# Patient Record
Sex: Female | Born: 1995 | Race: White | Hispanic: No | Marital: Single | Smoking: Former smoker
Health system: Southern US, Community
[De-identification: ages and names within clinical notes are randomized; demographics above are authoritative.]

## PROBLEM LIST (undated history)

## (undated) DIAGNOSIS — L0591 Pilonidal cyst without abscess: Secondary | ICD-10-CM

## (undated) DIAGNOSIS — F99 Mental disorder, not otherwise specified: Secondary | ICD-10-CM

## (undated) HISTORY — DX: Mental disorder, not otherwise specified: F99

## (undated) HISTORY — PX: TONSILLECTOMY: SUR1361

---

## 2016-06-06 ENCOUNTER — Emergency Department
Admission: EM | Admit: 2016-06-06 | Discharge: 2016-06-06 | Disposition: A | Discharge status: Home / Self Care | Payer: Self-pay | Attending: Emergency Medicine | Admitting: Emergency Medicine

## 2016-06-06 DIAGNOSIS — L03317 Cellulitis of buttock: Secondary | ICD-10-CM | POA: Insufficient documentation

## 2016-06-06 DIAGNOSIS — L0501 Pilonidal cyst with abscess: Secondary | ICD-10-CM | POA: Insufficient documentation

## 2016-06-06 DIAGNOSIS — Z87891 Personal history of nicotine dependence: Secondary | ICD-10-CM | POA: Insufficient documentation

## 2016-06-06 MED ORDER — TRAMADOL HCL 50 MG PO TABS
50.0000 mg | ORAL_TABLET | Freq: Once | ORAL | Status: AC
  Administered 2016-06-06: 50 mg via ORAL
  Filled 2016-06-06: qty 1

## 2016-06-06 MED ORDER — CLINDAMYCIN HCL 150 MG PO CAPS
300.0000 mg | ORAL_CAPSULE | Freq: Once | ORAL | Status: AC
Start: 2016-06-06 — End: 2016-06-06
  Administered 2016-06-06: 300 mg via ORAL
  Filled 2016-06-06: qty 2

## 2016-06-06 MED ORDER — LIDOCAINE HCL (PF) 1 % IJ SOLN
5.0000 mL | Freq: Once | INTRAMUSCULAR | Status: AC
  Administered 2016-06-06: 5 mL via INTRADERMAL
  Filled 2016-06-06: qty 5

## 2016-06-06 MED ORDER — LIDOCAINE-PRILOCAINE 2.5-2.5 % EX CREA: TOPICAL_CREAM | Freq: Once | CUTANEOUS | Status: DC

## 2016-06-06 MED ORDER — TRAMADOL HCL 50 MG PO TABS: 50.0000 mg | ORAL_TABLET | Freq: Four times a day (QID) | ORAL | Status: DC | PRN

## 2016-06-06 MED ORDER — CLINDAMYCIN HCL 300 MG PO CAPS: 300.0000 mg | ORAL_CAPSULE | Freq: Three times a day (TID) | ORAL | Status: DC

## 2016-06-06 NOTE — ED Notes (Signed)
4x4 gauze dressing applied

## 2016-06-06 NOTE — ED Notes (Signed)
Pt uprite on stretcher with no distress noted; reports abscess to coccyx with hx of same

## 2016-06-06 NOTE — ED Notes (Signed)
Pt in with co abscess to sacral area hx of the same 2  Years ago.

## 2016-06-06 NOTE — Discharge Instructions (Signed)
Cellulitis Cellulitis is an infection of the skin and the tissue beneath it. The infected area is usually red and tender. Cellulitis occurs most often in the arms and lower legs.  CAUSES  Cellulitis is caused by bacteria that enter the skin through cracks or cuts in the skin. The most common types of bacteria that cause cellulitis are staphylococci and streptococci. SIGNS AND SYMPTOMS   Redness and warmth.  Swelling.  Tenderness or pain.  Fever. DIAGNOSIS  Your health care provider can usually determine what is wrong based on a physical exam. Blood tests may also be done. TREATMENT  Treatment usually involves taking an antibiotic medicine. HOME CARE INSTRUCTIONS   Take your antibiotic medicine as directed by your health care provider. Finish the antibiotic even if you start to feel better.  Keep the infected arm or leg elevated to reduce swelling.  Apply a warm cloth to the affected area up to 4 times per day to relieve pain.  Take medicines only as directed by your health care provider.  Keep all follow-up visits as directed by your health care provider. SEEK MEDICAL CARE IF:   You notice red streaks coming from the infected area.  Your red area gets larger or turns dark in color.  Your bone or joint underneath the infected area becomes painful after the skin has healed.  Your infection returns in the same area or another area.  You notice a swollen bump in the infected area.  You develop new symptoms.  You have a fever. SEEK IMMEDIATE MEDICAL CARE IF:   You feel very sleepy.  You develop vomiting or diarrhea.  You have a general ill feeling (malaise) with muscle aches and pains.   This information is not intended to replace advice given to you by your health care provider. Make sure you discuss any questions you have with your health care provider.   Document Released: 09/24/2005 Document Revised: 09/05/2015 Document Reviewed: 03/01/2012 Elsevier Interactive  Patient Education 2016 Elsevier Inc.  Pilonidal Cyst A pilonidal cyst is a fluid-filled sac. It forms beneath the skin near your tailbone, at the top of the crease of your buttocks. A pilonidal cyst that is not large or infected may not cause symptoms or problems. If the cyst becomes irritated or infected, it may fill with pus. This causes pain and swelling (pilonidal abscess). An infected cyst may need to be treated with medicine, drained, or removed. CAUSES The cause of a pilonidal cyst is not known. One cause may be a hair that grows into your skin (ingrown hair). RISK FACTORS Pilonidal cysts are more common in boys and men. Risk factors include:  Having lots of hair near the crease of the buttocks.  Being overweight.  Having a pilonidal dimple.  Wearing tight clothing.  Not bathing or showering frequently.  Sitting for long periods of time. SIGNS AND SYMPTOMS Signs and symptoms of a pilonidal cyst may include:  Redness.  Pain and tenderness.  Warmth.  Swelling.  Pus.  Fever. DIAGNOSIS Your health care provider may diagnose a pilonidal cyst based on your symptoms and a physical exam. The health care provider may do a blood test to check for infection. If your cyst is draining pus, your health care provider may take a sample of the drainage to be tested at a laboratory. TREATMENT Surgery is the usual treatment for an infected pilonidal cyst. You may also have to take medicines before surgery. The type of surgery you have depends on the size and  severity of the infected cyst. The different kinds of surgery include:  Incision and drainage. This is a procedure to open and drain the cyst.  Marsupialization. In this procedure, a large cyst or abscess may be opened and kept open by stitching the edges of the skin to the cyst walls.  Cyst removal. This procedure involves opening the skin and removing all or part of the cyst. HOME CARE INSTRUCTIONS  Follow all of your  surgeon's instructions carefully if you had surgery.  Take medicines only as directed by your health care provider.  If you were prescribed an antibiotic medicine, finish it all even if you start to feel better.  Keep the area around your pilonidal cyst clean and dry.  Clean the area as directed by your health care provider. Pat the area dry with a clean towel. Do not rub it as this may cause bleeding.  Remove hair from the area around the cyst as directed by your health care provider.  Do not wear tight clothing or sit in one place for long periods of time.  There are many different ways to close and cover an incision, including stitches, skin glue, and adhesive strips. Follow your health care provider's instructions on:  Incision care.  Bandage (dressing) changes and removal.  Incision closure removal. SEEK MEDICAL CARE IF:   You have drainage, redness, swelling, or pain at the site of the cyst.  You have a fever.   This information is not intended to replace advice given to you by your health care provider. Make sure you discuss any questions you have with your health care provider.   Document Released: 12/12/2000 Document Revised: 01/05/2015 Document Reviewed: 05/04/2014 Elsevier Interactive Patient Education [employer]2016 Elsevier Inc.

## 2016-06-06 NOTE — ED Provider Notes (Signed)
[facility] Emergency Department Provider Note   ____________________________________________  Time seen: Approximately 454 AM  I have reviewed the triage vital signs and the nursing notes.   HISTORY  Chief Complaint Abscess    HPI Jennifer Terrell is a 20 y.o. female who comes into the hospital today with a cyst on her tailbone. The patient reports that she noticed it last week. She reports that yesterday she put some cream on it but it got worse and much better. The patient reports that currently her pain is a 3 out of 10 in intensity but is worse with movement or she tries to lay on it. The patient denies any fevers, nausea, vomiting, dizziness or lightheadedness. The patient had a similar cyst about 2 years ago and reports that it was drained without any difficulty. She is here for evaluation.   No past medical history  There are no active problems to display for this patient.   Past surgical history. Tonsillectomy  Current Outpatient Rx  Name  Route  Sig  Dispense  Refill  . clindamycin (CLEOCIN) 300 MG capsule   Oral   Take 1 capsule (300 mg total) by mouth 3 (three) times daily.   30 capsule   0   . traMADol (ULTRAM) 50 MG tablet   Oral   Take 1 tablet (50 mg total) by mouth every 6 (six) hours as needed.   12 tablet   0     Allergies Hydrocodone and Percocet  No family history on file.  Social History Social History  Substance Use Topics  . Smoking status: smoker  . Smokeless tobacco: Not on file  . Alcohol Use: none    Review of Systems Constitutional: No fever/chills Eyes: No visual changes. ENT: No sore throat. Cardiovascular: Denies chest pain. Respiratory: Denies shortness of breath. Gastrointestinal: No abdominal pain.  No nausea, no vomiting.  No diarrhea.  No constipation. Genitourinary: Negative for dysuria. Musculoskeletal: Negative for back pain. Skin: abscess Neurological: Negative for headaches, focal  weakness or numbness.  10-point ROS otherwise negative.  ____________________________________________   PHYSICAL EXAM:  VITAL SIGNS: ED Triage Vitals  Enc Vitals Group     BP 06/06/16 0136 121/67 mmHg     Pulse Rate 06/06/16 0136 116     Resp 06/06/16 0136 18     Temp 06/06/16 0136 98.3 F (36.8 C)     Temp Source 06/06/16 0136 Oral     SpO2 06/06/16 0136 100 %     Weight 06/06/16 0136 240 lb (108.863 kg)     Height 06/06/16 0136 5\' 4"  (1.626 m)     Head Cir --      Peak Flow --      Pain Score 06/06/16 0136 8     Pain Loc --      Pain Edu? --      Excl. in GC? --     Constitutional: Alert and oriented. Well appearing and in moderate distress. Eyes: Conjunctivae are normal. PERRL. EOMI. Head: Atraumatic. Nose: No congestion/rhinnorhea. Mouth/Throat: Mucous membranes are moist.  Oropharynx non-erythematous. Cardiovascular: Normal rate, regular rhythm. Grossly normal heart sounds.  Good peripheral circulation. Respiratory: Normal respiratory effort.  No retractions. Lungs CTAB. Gastrointestinal: Soft and nontender. No distention. Positive bowel sounds Musculoskeletal: No lower extremity tenderness nor edema.   Neurologic:  Normal speech and language.  Skin:  Abscess to gluteal cleft  Psychiatric: Mood and affect are normal.   ____________________________________________   LABS (all labs ordered are  listed, but only abnormal results are displayed)  Labs Reviewed - No data to display ____________________________________________  EKG  none ____________________________________________  RADIOLOGY  none ____________________________________________   PROCEDURES  Procedure(s) performed: please, see procedure note(s).   INCISION AND DRAINAGE Performed by: Lucrezia Europe P Consent: Verbal consent obtained. Risks and benefits: risks, benefits and alternatives were discussed Type: abscess  Body area: gluteal cleft  Anesthesia: local  infiltration  Incision was made with a scalpel.  Local anesthetic: lidocaine 1% without epinephrine  Anesthetic total: 2 ml  Complexity: complex Blunt dissection to break up loculations  Drainage: purulent  Drainage amount: moderate  Packing material: 1/4 in iodoform gauze  Patient tolerance: Patient tolerated the procedure well with no immediate complications.    Critical Care performed: No  ____________________________________________   INITIAL IMPRESSION / ASSESSMENT AND PLAN / ED COURSE  Pertinent labs & imaging results that were available during my care of the patient were reviewed by me and considered in my medical decision making (see chart for details).  This is a 20 year old female who comes into the hospital today with a pilonidal abscess. I did drain the abscess and obtain a moderate amount of drainage. I did pack the wound is well. The area was red so I also gave the patient dose of clindamycin and some tramadol. The patient be discharged home. I informed the patient she should return in 2 days to have the packing removed and reassessed. The patient understands and she will be discharged home and follow back up in 2 days. ____________________________________________   FINAL CLINICAL IMPRESSION(S) / ED DIAGNOSES  Final diagnoses:  Pilonidal abscess  Cellulitis of buttock      NEW MEDICATIONS STARTED DURING THIS VISIT:  Discharge Medication List as of 06/06/2016  6:30 AM    START taking these medications   Details  clindamycin (CLEOCIN) 300 MG capsule Take 1 capsule (300 mg total) by mouth 3 (three) times daily., Starting 06/06/2016, Until Discontinued, Print    traMADol (ULTRAM) 50 MG tablet Take 1 tablet (50 mg total) by mouth every 6 (six) hours as needed., Starting 06/06/2016, Until Discontinued, Print         Note:  This document was prepared using Dragon voice recognition software and may include unintentional dictation errors.    Rebecka Apley, MD 06/06/16 [phone]

## 2016-08-12 LAB — HM PAP SMEAR: HM Pap smear: NEGATIVE

## 2016-08-12 LAB — HM HIV SCREENING LAB: HM HIV Screening: NEGATIVE

## 2016-08-13 DIAGNOSIS — F419 Anxiety disorder, unspecified: Secondary | ICD-10-CM | POA: Insufficient documentation

## 2016-08-14 DIAGNOSIS — F331 Major depressive disorder, recurrent, moderate: Secondary | ICD-10-CM | POA: Insufficient documentation

## 2016-10-01 ENCOUNTER — Emergency Department
Admission: EM | Admit: 2016-10-01 | Discharge: 2016-10-01 | Disposition: A | Discharge status: Home / Self Care | Payer: Self-pay | Attending: Emergency Medicine | Admitting: Emergency Medicine

## 2016-10-01 ENCOUNTER — Emergency Department: Payer: Self-pay

## 2016-10-01 ENCOUNTER — Encounter: Payer: Self-pay | Admitting: Intensive Care

## 2016-10-01 DIAGNOSIS — J069 Acute upper respiratory infection, unspecified: Secondary | ICD-10-CM | POA: Insufficient documentation

## 2016-10-01 DIAGNOSIS — B9789 Other viral agents as the cause of diseases classified elsewhere: Secondary | ICD-10-CM

## 2016-10-01 DIAGNOSIS — R110 Nausea: Secondary | ICD-10-CM | POA: Insufficient documentation

## 2016-10-01 DIAGNOSIS — F17290 Nicotine dependence, other tobacco product, uncomplicated: Secondary | ICD-10-CM | POA: Insufficient documentation

## 2016-10-01 HISTORY — DX: Pilonidal cyst without abscess: L05.91

## 2016-10-01 LAB — CBC
HCT: 41 % (ref 35.0–47.0)
HEMOGLOBIN: 13.6 g/dL (ref 12.0–16.0)
MCH: 25.9 pg — AB (ref 26.0–34.0)
MCHC: 33.1 g/dL (ref 32.0–36.0)
MCV: 78.1 fL — ABNORMAL LOW (ref 80.0–100.0)
Platelets: 273 10*3/uL (ref 150–440)
RBC: 5.25 MIL/uL — ABNORMAL HIGH (ref 3.80–5.20)
RDW: 15.6 % — AB (ref 11.5–14.5)
WBC: 9.6 10*3/uL (ref 3.6–11.0)

## 2016-10-01 LAB — URINALYSIS COMPLETE WITH MICROSCOPIC (ARMC ONLY)
Bacteria, UA: NONE SEEN
Bilirubin Urine: NEGATIVE
GLUCOSE, UA: NEGATIVE mg/dL
HGB URINE DIPSTICK: NEGATIVE
Ketones, ur: NEGATIVE mg/dL
Nitrite: NEGATIVE
Protein, ur: 30 mg/dL — AB
Specific Gravity, Urine: 1.029 (ref 1.005–1.030)
pH: 6 (ref 5.0–8.0)

## 2016-10-01 LAB — BASIC METABOLIC PANEL
ANION GAP: 6 (ref 5–15)
BUN: 11 mg/dL (ref 6–20)
CALCIUM: 9.4 mg/dL (ref 8.9–10.3)
CO2: 24 mmol/L (ref 22–32)
CREATININE: 0.88 mg/dL (ref 0.44–1.00)
Chloride: 109 mmol/L (ref 101–111)
GFR calc Af Amer: >60 mL/min (ref >60)
GFR calc non Af Amer: >60 mL/min (ref >60)
GLUCOSE: 112 mg/dL — AB (ref 65–99)
Potassium: 4.2 mmol/L (ref 3.5–5.1)
Sodium: 139 mmol/L (ref 135–145)

## 2016-10-01 LAB — TROPONIN I

## 2016-10-01 LAB — POCT PREGNANCY, URINE: PREG TEST UR: NEGATIVE

## 2016-10-01 MED ORDER — ONDANSETRON HCL 4 MG/2ML IJ SOLN
4.0000 mg | Freq: Once | INTRAMUSCULAR | Status: AC
  Administered 2016-10-01: 4 mg via INTRAVENOUS
  Filled 2016-10-01: qty 2

## 2016-10-01 MED ORDER — SODIUM CHLORIDE 0.9 % IV BOLUS (SEPSIS)
1000.0000 mL | Freq: Once | INTRAVENOUS | Status: AC
  Administered 2016-10-01: 1000 mL via INTRAVENOUS

## 2016-10-01 MED ORDER — ONDANSETRON 4 MG PO TBDP: 4.0000 mg | ORAL_TABLET | Freq: Three times a day (TID) | ORAL | 0 refills | Status: DC | PRN

## 2016-10-01 NOTE — Discharge Instructions (Signed)

## 2016-10-01 NOTE — ED Provider Notes (Signed)
[facility] Emergency Department Provider Note  ____________________________________________  Time seen: Approximately 4:20 PM  I have reviewed the triage vital signs and the nursing notes.   HISTORY  Chief Complaint Chest Pain and Nausea   HPI Jennifer Terrell is a 20 y.o. female no significant past medical history who presents for evaluation of nausea and dizziness. Patient reports that her symptoms have been going on for 2 days. She reports that she feels dizzy like she is going to pass out especially when she stands up. She has had nausea but normal appetite and has been eating and drinking. She denies fever but endorses body aches and sore throat, no HA or neck stiffness or rash. She also has a dry cough. No chills or night sweats. No abdominal pain, no vomiting, no diarrhea. She reports that she has been constipated since Monday but denies abdominal distention, she is passing flatus, no prior abdominal surgeries. No dysuria or hematuria. No vaginal discharge. She also complained of 2 episodes in the last 2 days of a sharp midline chest pain that comes on and lasts a few seconds and resolves with no intervention. This pain is not associated with diaphoresis, shortness of breath, or the episodes of dizziness. Last episode was > 24 hours ago. She has no family history of ischemic heart disease. LMP 2 months ago, patient had nexplanon placed.   Past Medical History:  Diagnosis Date  . Cyst near tailbone     There are no active problems to display for this patient.   Past Surgical History:  Procedure Laterality Date  . TONSILLECTOMY      Prior to Admission medications   Medication Sig Start Date End Date Taking? Authorizing Provider  clindamycin (CLEOCIN) 300 MG capsule Take 1 capsule (300 mg total) by mouth 3 (three) times daily. 06/06/16   Rebecka Apley, MD  ondansetron (ZOFRAN ODT) 4 MG disintegrating tablet Take 1 tablet (4 mg total) by mouth every 8  (eight) hours as needed for nausea or vomiting. 10/01/16   Nita Sickle, MD  traMADol (ULTRAM) 50 MG tablet Take 1 tablet (50 mg total) by mouth every 6 (six) hours as needed. 06/06/16   Rebecka Apley, MD    Allergies Hydrocodone and Percocet [oxycodone-acetaminophen]  History reviewed. No pertinent family history.  Social History Social History  Substance Use Topics  . Smoking status: Current Every Day Smoker    Packs/day: 0.50    Types: Cigarettes, E-cigarettes  . Smokeless tobacco: Never Used  . Alcohol use Yes     Comment: rare    Review of Systems  Constitutional: Negative for fever. + Dizziness Eyes: Negative for visual changes. ENT: Negative for sore throat. Cardiovascular: Negative for chest pain. Respiratory: Negative for shortness of breath. Gastrointestinal: Negative for abdominal pain, vomiting or diarrhea. + nausea Genitourinary: Negative for dysuria. Musculoskeletal: Negative for back pain. Skin: Negative for rash. Neurological: Negative for headaches, weakness or numbness.  ____________________________________________   PHYSICAL EXAM:  VITAL SIGNS: ED Triage Vitals  Enc Vitals Group     BP 10/01/16 1532 132/80     Pulse Rate 10/01/16 1532 (!) 101     Resp 10/01/16 1532 18     Temp 10/01/16 1532 98.3 F (36.8 C)     Temp Source 10/01/16 1532 Oral     SpO2 10/01/16 1532 99 %     Weight 10/01/16 1533 261 lb (118.4 kg)     Height 10/01/16 1533 5\' 4"  (1.626 m)  Head Circumference --      Peak Flow --      Pain Score 10/01/16 1533 3     Pain Loc --      Pain Edu? --      Excl. in GC? --     Constitutional: Alert and oriented. Well appearing and in no apparent distress. HEENT:      Head: Normocephalic and atraumatic.         Eyes: Conjunctivae are normal. Sclera is non-icteric. EOMI. PERRL      Mouth/Throat: Mucous membranes are moist. Oropharynx is clear      Neck: Supple with no signs of meningismus. Cardiovascular: Tachycardic with  regular rhythm. No murmurs, gallops, or rubs. 2+ symmetrical distal pulses are present in all extremities. No JVD. Respiratory: Normal respiratory effort. Lungs are clear to auscultation bilaterally. No wheezes, crackles, or rhonchi.  Gastrointestinal: Soft, non tender, and non distended with positive bowel sounds. No rebound or guarding. Genitourinary: No CVA tenderness. Musculoskeletal: Nontender with normal range of motion in all extremities. No edema, cyanosis, or erythema of extremities. Neurologic: Normal speech and language. Face is symmetric. Moving all extremities. No gross focal neurologic deficits are appreciated. Skin: Skin is warm, dry and intact. No rash noted. Psychiatric: Mood and affect are normal. Speech and behavior are normal.  ____________________________________________   LABS (all labs ordered are listed, but only abnormal results are displayed)  Labs Reviewed  BASIC METABOLIC PANEL - Abnormal; Notable for the following:       Result Value   Glucose, Bld 112 (*)    All other components within normal limits  CBC - Abnormal; Notable for the following:    RBC 5.25 (*)    MCV 78.1 (*)    MCH 25.9 (*)    RDW 15.6 (*)    All other components within normal limits  URINALYSIS COMPLETEWITH MICROSCOPIC ([facility] ONLY) - Abnormal; Notable for the following:    Color, Urine YELLOW (*)    APPearance CLOUDY (*)    Protein, ur 30 (*)    Leukocytes, UA 3+ (*)    Squamous Epithelial / LPF 6-30 (*)    All other components within normal limits  URINE CULTURE  TROPONIN I  POC URINE PREG, ED  POCT PREGNANCY, URINE   ____________________________________________  EKG  ED ECG REPORT I, Nita Sickle, the attending physician, personally viewed and interpreted this ECG.  Normal sinus rhythm, rate of 96, normal intervals, normal axis, no ST elevations or depressions, T-wave inversion in lead 3 and flattening in aVF. No prior for  comparison  ____________________________________________  RADIOLOGY  CXR: Negative ____________________________________________   PROCEDURES  Procedure(s) performed: None Procedures Critical Care performed:  None ____________________________________________   INITIAL IMPRESSION / ASSESSMENT AND PLAN / ED COURSE   20 y.o. female no significant past medical history who presents for evaluation of nausea, dizziness, cough, sore throat, and body aches x 2 days most likely from viral process. Patient with no fever. Mildly tachycardic possibly from dehydration with nausea x 2 days. Will check basic labs, UA, CXR. Will give IVF and IV zofran.  Clinical Course  Comment By Time  Blood work within normal limits. UA with 3+ leuks but no bacteria. Patient is young and healthy and has no symptoms of dysuria. Urine culture was sent. Will not treat at this time. Chest x-ray with no evidence of pneumonia. Patient remains hemodynamically stable and no longer tachycardic after fluids. She feels markedly improved with no nausea, tolerating by mouth, and  resolution of her dizziness. Patient will be discharged home with by mouth Zofran, by mouth hydration, and close follow-up. Nita Sicklearolina Tavarious Freel, MD 10/04 [phone]    Pertinent labs & imaging results that were available during my care of the patient were reviewed by me and considered in my medical decision making (see chart for details).    ____________________________________________   FINAL CLINICAL IMPRESSION(S) / ED DIAGNOSES  Final diagnoses:  Nausea  Viral URI with cough      NEW MEDICATIONS STARTED DURING THIS VISIT:  New Prescriptions   ONDANSETRON (ZOFRAN ODT) 4 MG DISINTEGRATING TABLET    Take 1 tablet (4 mg total) by mouth every 8 (eight) hours as needed for nausea or vomiting.     Note:  This document was prepared using Dragon voice recognition software and may include unintentional dictation errors.    Nita Sicklearolina Cailynn Bodnar,  MD 10/01/16 1806

## 2016-10-01 NOTE — ED Triage Notes (Addendum)
Patient reports to ER with c/o sharp chest pain on and off, nausea, generalized pain and dizziness that started on Sunday. Pt denies emesis or diarrhea. Pt ambulatory in triage with NAD noted. Unlabored breathing. A&O X4

## 2016-10-01 NOTE — ED Notes (Signed)
Pt verbalized understanding of discharge instructions. NAD at this time. 

## 2016-10-02 LAB — URINE CULTURE

## 2016-11-27 ENCOUNTER — Emergency Department
Admission: EM | Admit: 2016-11-27 | Discharge: 2016-11-27 | Disposition: A | Discharge status: Home / Self Care | Payer: Self-pay | Attending: Student in an Organized Health Care Education/Training Program | Admitting: Student in an Organized Health Care Education/Training Program

## 2016-11-27 DIAGNOSIS — L0231 Cutaneous abscess of buttock: Secondary | ICD-10-CM | POA: Insufficient documentation

## 2016-11-27 DIAGNOSIS — L0291 Cutaneous abscess, unspecified: Secondary | ICD-10-CM

## 2016-11-27 DIAGNOSIS — F17210 Nicotine dependence, cigarettes, uncomplicated: Secondary | ICD-10-CM | POA: Insufficient documentation

## 2016-11-27 MED ORDER — SULFAMETHOXAZOLE-TRIMETHOPRIM 800-160 MG PO TABS: 1.0000 | ORAL_TABLET | Freq: Two times a day (BID) | ORAL | 0 refills | Status: DC

## 2016-11-27 MED ORDER — CEPHALEXIN 500 MG PO CAPS: 500.0000 mg | ORAL_CAPSULE | Freq: Three times a day (TID) | ORAL | 0 refills | Status: DC

## 2016-11-27 MED ORDER — IBUPROFEN 800 MG PO TABS
800.0000 mg | ORAL_TABLET | Freq: Three times a day (TID) | ORAL | 0 refills | Status: DC | PRN
Start: 2016-11-27 — End: 2017-05-23

## 2016-11-27 MED ORDER — TRAMADOL HCL 50 MG PO TABS: 50.0000 mg | ORAL_TABLET | Freq: Four times a day (QID) | ORAL | 0 refills | Status: DC | PRN

## 2016-11-27 NOTE — ED Provider Notes (Signed)
[facility] Emergency Department Provider Note  ____________________________________________  Time seen: Approximately 1:22 PM  I have reviewed the triage vital signs and the nursing notes.   HISTORY  Chief Complaint Abscess    HPI Jennifer Terrell is a 20 y.o. female , NAD, presents to the emergency room with three-day history of buttock abscess. Patient states she has had an abscess in her gluteal cleft a few months back that needed incision and drainage. Has noted pain and swelling in this area again over the last couple of days. Is not any active oozing, weeping. Denies any injuries or traumas. Has had no fevers, chills or body aches. Has had no abdominal pain, nausea, vomiting, diarrhea. Denies saddle paresthesias or loss of bowel or bladder control. No numbness, weakness, tingling. No chest pain or shortness of breath. Has been complaining warm compresses which seems to help.   Past Medical History:  Diagnosis Date  . Cyst near tailbone     There are no active problems to display for this patient.   Past Surgical History:  Procedure Laterality Date  . TONSILLECTOMY      Prior to Admission medications   Medication Sig Start Date End Date Taking? Authorizing Provider  cephALEXin (KEFLEX) 500 MG capsule Take 1 capsule (500 mg total) by mouth 3 (three) times daily. 11/27/16   Jami L Hagler, PA-C  clindamycin (CLEOCIN) 300 MG capsule Take 1 capsule (300 mg total) by mouth 3 (three) times daily. 06/06/16   Rebecka ApleyAllison P Webster, MD  ibuprofen (ADVIL,MOTRIN) 800 MG tablet Take 1 tablet (800 mg total) by mouth every 8 (eight) hours as needed (pain). 11/27/16   Jami L Hagler, PA-C  ondansetron (ZOFRAN ODT) 4 MG disintegrating tablet Take 1 tablet (4 mg total) by mouth every 8 (eight) hours as needed for nausea or vomiting. 10/01/16   Nita Sicklearolina Veronese, MD  sulfamethoxazole-trimethoprim (BACTRIM DS,SEPTRA DS) 800-160 MG tablet Take 1 tablet by mouth 2 (two) times daily.  11/27/16   Jami L Hagler, PA-C  traMADol (ULTRAM) 50 MG tablet Take 1 tablet (50 mg total) by mouth every 6 (six) hours as needed. 11/27/16   Jami L Hagler, PA-C    Allergies Hydrocodone and Percocet [oxycodone-acetaminophen]  No family history on file.  Social History Social History  Substance Use Topics  . Smoking status: Current Every Day Smoker    Packs/day: 0.50    Types: Cigarettes, E-cigarettes  . Smokeless tobacco: Never Used  . Alcohol use Yes     Comment: rare     Review of Systems  Constitutional: No fever/chills Cardiovascular: No chest pain. Respiratory: No shortness of breath.  Gastrointestinal: No abdominal pain.  No nausea, vomiting.   Musculoskeletal: Negative for back pain.  Skin: Positive skin sore buttocks. Negative for rash, redness, open wounds, is, weeping. Neurological: Negative for cell paresthesias or loss of bowel or bladder control. No numbness, weakness, tingling. 10-point ROS otherwise negative.  ____________________________________________   PHYSICAL EXAM:  VITAL SIGNS: ED Triage Vitals [11/27/16 1246]  Enc Vitals Group     BP 132/89     Pulse Rate 89     Resp 17     Temp 98.5 F (36.9 C)     Temp Source Oral     SpO2 100 %     Weight 266 lb (120.7 kg)     Height 5\' 4"  (1.626 m)     Head Circumference      Peak Flow      Pain Score 4  Pain Loc      Pain Edu?      Excl. in GC?      Constitutional: Alert and oriented. Well appearing and in no acute distress. Eyes: Conjunctivae are normal.  Head: Atraumatic. Cardiovascular:Good peripheral circulation. Respiratory: Normal respiratory effort without tachypnea or retractions.  Musculoskeletal: No lower extremity tenderness nor edema.  No joint effusions. Neurologic:  Normal speech and language. No gross focal neurologic deficits are appreciated.  Skin:  8mm x 4mm area of superficial fluctuance without underlying induration nor any area of cellulitis, redness or abnormal  warmth noted. No active oozing or weeping. Skin is warm, dry and intact. No rash noted. Psychiatric: Mood and affect are normal. Speech and behavior are normal. Patient exhibits appropriate insight and judgement.   ____________________________________________   LABS  None ____________________________________________  EKG  None ____________________________________________  RADIOLOGY  None ____________________________________________    PROCEDURES  Procedure(s) performed: None   Procedures   Medications - No data to display   ____________________________________________   INITIAL IMPRESSION / ASSESSMENT AND PLAN / ED COURSE  Pertinent labs & imaging results that were available during my care of the patient were reviewed by me and considered in my medical decision making (see chart for details).  Clinical Course     Patient's diagnosis is consistent with Abscess. Considering patient has had history of pilonidal cyst with infection in the past, we'll go ahead and treat for such at this time to hopefully avoid any further abscess formation. Patient will be discharged home with prescriptions for Keflex, Bactrim DS, tramadol and ibuprofen to take as directed. Patient is to follow up with Dr. Aleen CampiPiscoya in general surgery if symptoms persist past this treatment course. Patient is given ED precautions to return to the ED for any worsening or new symptoms.    ____________________________________________  FINAL CLINICAL IMPRESSION(S) / ED DIAGNOSES  Final diagnoses:  Abscess      NEW MEDICATIONS STARTED DURING THIS VISIT:  Discharge Medication List as of 11/27/2016  1:24 PM    START taking these medications   Details  cephALEXin (KEFLEX) 500 MG capsule Take 1 capsule (500 mg total) by mouth 3 (three) times daily., Starting Thu 11/27/2016, Print    ibuprofen (ADVIL,MOTRIN) 800 MG tablet Take 1 tablet (800 mg total) by mouth every 8 (eight) hours as needed (pain).,  Starting Thu 11/27/2016, Print    sulfamethoxazole-trimethoprim (BACTRIM DS,SEPTRA DS) 800-160 MG tablet Take 1 tablet by mouth 2 (two) times daily., Starting Thu 11/27/2016, Print             Hope PigeonJami L Hagler, PA-C 11/27/16 1606    Willy EddyPatrick Robinson, MD 11/27/16 1606

## 2016-11-27 NOTE — ED Triage Notes (Signed)
Pt c/o abscess between buttock /lower back for the past 3 days

## 2016-11-30 DIAGNOSIS — F17210 Nicotine dependence, cigarettes, uncomplicated: Secondary | ICD-10-CM | POA: Insufficient documentation

## 2016-11-30 DIAGNOSIS — Z79899 Other long term (current) drug therapy: Secondary | ICD-10-CM | POA: Insufficient documentation

## 2016-11-30 DIAGNOSIS — L0501 Pilonidal cyst with abscess: Secondary | ICD-10-CM | POA: Insufficient documentation

## 2016-12-01 ENCOUNTER — Encounter: Payer: Self-pay | Admitting: Emergency Medicine

## 2016-12-01 ENCOUNTER — Emergency Department
Admission: EM | Admit: 2016-12-01 | Discharge: 2016-12-01 | Disposition: A | Discharge status: Home / Self Care | Payer: Self-pay | Attending: Emergency Medicine | Admitting: Emergency Medicine

## 2016-12-01 DIAGNOSIS — L0501 Pilonidal cyst with abscess: Secondary | ICD-10-CM

## 2016-12-01 MED ORDER — BUPIVACAINE HCL (PF) 0.5 % IJ SOLN
INTRAMUSCULAR | Status: AC
  Filled 2016-12-01: qty 30

## 2016-12-01 MED ORDER — LIDOCAINE HCL (PF) 1 % IJ SOLN
INTRAMUSCULAR | Status: AC
  Filled 2016-12-01: qty 5

## 2016-12-01 NOTE — ED Notes (Signed)
Pt has returned to lobby; treatment room available

## 2016-12-01 NOTE — ED Notes (Signed)
Patient reports seen here on 11/30 for abscess, prescribed antibiotics.  Patient and family reports that area is now larger and more painful.

## 2016-12-01 NOTE — Discharge Instructions (Signed)
1. Finish your antibiotics as previously prescribed. 2. Wound recheck in 2-3 days. 3. Return to the ER for worsening symptoms, fever, persistent vomiting or other concerns.

## 2016-12-01 NOTE — ED Provider Notes (Signed)
[facility] Emergency Department Provider Note   ____________________________________________   First MD Initiated Contact with Patient 12/01/16 [phone]     (approximate)  I have reviewed the triage vital signs and the nursing notes.   HISTORY  Chief Complaint Abscess    HPI Aris Evertslicia Labrum is a 20 y.o. female who presents to the ED from home with a chief complaint of abscess. Patient was seen in the emergency department on 11/34 pilonidal cyst. She was placed onKeflex and Bactrim. Reports increased size of cyst. Denies drainage. Symptoms associated with nausea. Denies associated fever, chills, chest pain, shortness of breath, abdominal pain, nausea, vomiting, diarrhea. Denies recent travel or trauma. Nothing makes her symptoms better. Sitting makes her symptoms worse.   Past Medical History:  Diagnosis Date  . Cyst near tailbone     There are no active problems to display for this patient.   Past Surgical History:  Procedure Laterality Date  . TONSILLECTOMY      Prior to Admission medications   Medication Sig Start Date End Date Taking? Authorizing Provider  cephALEXin (KEFLEX) 500 MG capsule Take 1 capsule (500 mg total) by mouth 3 (three) times daily. 11/27/16  Yes Jami L Hagler, PA-C  ibuprofen (ADVIL,MOTRIN) 800 MG tablet Take 1 tablet (800 mg total) by mouth every 8 (eight) hours as needed (pain). 11/27/16  Yes Jami L Hagler, PA-C  sulfamethoxazole-trimethoprim (BACTRIM DS,SEPTRA DS) 800-160 MG tablet Take 1 tablet by mouth 2 (two) times daily. 11/27/16  Yes Jami L Hagler, PA-C    Allergies Hydrocodone and Percocet [oxycodone-acetaminophen]  History reviewed. No pertinent family history.  Social History Social History  Substance Use Topics  . Smoking status: Current Every Day Smoker    Packs/day: 0.50    Types: Cigarettes, E-cigarettes  . Smokeless tobacco: Never Used  . Alcohol use Yes     Comment: rare    Review of  Systems  Constitutional: No fever/chills. Eyes: No visual changes. ENT: No sore throat. Cardiovascular: Denies chest pain. Respiratory: Denies shortness of breath. Gastrointestinal: No abdominal pain.  Positive for  nausea, no vomiting.  No diarrhea.  No constipation. Genitourinary: Negative for dysuria. Musculoskeletal: Negative for back pain. Skin: Positive for abscess. Negative for rash. Neurological: Negative for headaches, focal weakness or numbness.  10-point ROS otherwise negative.  ____________________________________________   PHYSICAL EXAM:  VITAL SIGNS: ED Triage Vitals  Enc Vitals Group     BP 12/01/16 0011 126/81     Pulse Rate 12/01/16 0011 (!) 126     Resp 12/01/16 0011 20     Temp 12/01/16 0011 98.9 F (37.2 C)     Temp Source 12/01/16 0011 Oral     SpO2 12/01/16 0011 99 %     Weight 12/01/16 0012 266 lb (120.7 kg)     Height 12/01/16 0012 5\' 4"  (1.626 m)     Head Circumference --      Peak Flow --      Pain Score 12/01/16 0012 8     Pain Loc --      Pain Edu? --      Excl. in GC? --     Constitutional: Alert and oriented. Well appearing and in no acute distress. Eyes: Conjunctivae are normal. PERRL. EOMI. Head: Atraumatic. Nose: No congestion/rhinnorhea. Mouth/Throat: Mucous membranes are moist.  Oropharynx non-erythematous. Neck: No stridor.   Cardiovascular: Normal rate, regular rhythm. Grossly normal heart sounds.  Good peripheral circulation. Respiratory: Normal respiratory effort.  No retractions. Lungs CTAB.  Gastrointestinal: Soft and nontender. No distention. No abdominal bruits. No CVA tenderness. Musculoskeletal: No lower extremity tenderness nor edema.  No joint effusions. Neurologic:  Normal speech and language. No gross focal neurologic deficits are appreciated. No gait instability. Skin:  Approximately 2 cm in diameter pilonidal cyst with some weeping but no drainage. Skin is warm, dry and intact. No rash noted. Psychiatric: Mood and  affect are normal. Speech and behavior are normal.  ____________________________________________   LABS (all labs ordered are listed, but only abnormal results are displayed)  Labs Reviewed - No data to display ____________________________________________  EKG  None ____________________________________________  RADIOLOGY  None ____________________________________________   PROCEDURES  Procedure(s) performed:   INCISION AND DRAINAGE Performed by: Irean HongSUNG,JADE J Consent: Verbal consent obtained. Risks and benefits: risks, benefits and alternatives were discussed Type: abscess  Body area: Pilonidal  Anesthesia: local infiltration  Incision was made with a scalpel.  Local anesthetic: lidocaine 1% w/o epinephrine  Anesthetic total: 8 ml  Complexity: complex Blunt dissection to break up loculations  Drainage: purulent  Drainage amount: large  Packing material: 1/4 in iodoform gauze  Patient tolerance: Patient tolerated the procedure well with no immediate complications.    Procedures  Critical Care performed: No  ____________________________________________   INITIAL IMPRESSION / ASSESSMENT AND PLAN / ED COURSE  Pertinent labs & imaging results that were available during my care of the patient were reviewed by me and considered in my medical decision making (see chart for details).  20 year old female who presents with worsening pilonidal cyst despite Keflex and Bactrim 5 days. Tolerated I&D with packing well. She will finish out her antibiotics and have a wound recheck in 2-3 days. Strict return precautions given. Patient verbalizes understanding and agrees with plan of care.  Clinical Course      ____________________________________________   FINAL CLINICAL IMPRESSION(S) / ED DIAGNOSES  Final diagnoses:  Pilonidal abscess      NEW MEDICATIONS STARTED DURING THIS VISIT:  Discharge Medication List as of 12/01/2016  4:41 AM       Note:   This document was prepared using Dragon voice recognition software and may include unintentional dictation errors.    Irean HongJade J Sung, MD 12/01/16 [phone]

## 2016-12-01 NOTE — ED Notes (Signed)
Called for pt in lobby to recheck vital signs; not present; stat registration clerk, Tresa EndoKelly, says pt has been going out to smoke; will call pt again in a few minutes

## 2016-12-01 NOTE — ED Triage Notes (Signed)
Pt seen here on 11/30 and diagnosed with pilonidal cyst; put on antibiotics; area now larger, more painful; no drainage;

## 2017-05-23 ENCOUNTER — Encounter: Payer: Self-pay | Admitting: Emergency Medicine

## 2017-05-23 ENCOUNTER — Emergency Department
Admission: EM | Admit: 2017-05-23 | Discharge: 2017-05-23 | Disposition: A | Discharge status: Home / Self Care | Payer: Self-pay | Attending: Emergency Medicine | Admitting: Emergency Medicine

## 2017-05-23 DIAGNOSIS — B349 Viral infection, unspecified: Secondary | ICD-10-CM | POA: Insufficient documentation

## 2017-05-23 DIAGNOSIS — E860 Dehydration: Secondary | ICD-10-CM | POA: Insufficient documentation

## 2017-05-23 DIAGNOSIS — F17210 Nicotine dependence, cigarettes, uncomplicated: Secondary | ICD-10-CM | POA: Insufficient documentation

## 2017-05-23 MED ORDER — SODIUM CHLORIDE 0.9 % IV BOLUS (SEPSIS)
1000.0000 mL | Freq: Once | INTRAVENOUS | Status: AC
  Administered 2017-05-23: 1000 mL via INTRAVENOUS

## 2017-05-23 MED ORDER — ONDANSETRON 4 MG PO TBDP: 4.0000 mg | ORAL_TABLET | Freq: Three times a day (TID) | ORAL | 0 refills | Status: AC | PRN

## 2017-05-23 NOTE — ED Provider Notes (Signed)
[facility] Emergency Department Provider Note  ____________________________________________   First MD Initiated Contact with Patient 05/23/17 1346     (approximate)  I have reviewed the triage vital signs and the nursing notes.   HISTORY  Chief Complaint Cough; Nasal Congestion; and Diarrhea    HPI Jennifer Terrell is a 21 y.o. female is here complaining of cough, congestion, fever and diarrhea. Patient states that her last episode of diarrhea was last evening. She has had fever intermittently and is taken over-the-counter medication for this. She states that she was exposed to similar symptoms from her boss. She is continue to drink clear fluids. She denies any continued upper respiratory symptoms and has been unaware of any recent fevers. She denies any pain at this time.   Past Medical History:  Diagnosis Date  . Cyst near tailbone     There are no active problems to display for this patient.   Past Surgical History:  Procedure Laterality Date  . TONSILLECTOMY      Prior to Admission medications   Medication Sig Start Date End Date Taking? Authorizing Provider  ondansetron (ZOFRAN ODT) 4 MG disintegrating tablet Take 1 tablet (4 mg total) by mouth every 8 (eight) hours as needed for nausea or vomiting. 05/23/17   Tommi RumpsSummers, Lowry Bala L, PA-C    Allergies Hydrocodone and Percocet [oxycodone-acetaminophen]  No family history on file.  Social History Social History  Substance Use Topics  . Smoking status: Current Every Day Smoker    Packs/day: 0.50    Types: Cigarettes, E-cigarettes  . Smokeless tobacco: Never Used  . Alcohol use Yes     Comment: rare    Review of Systems  Constitutional: No fever/chills ENT: No sore throat. Cardiovascular: Denies chest pain. Respiratory: Denies shortness of breath. Gastrointestinal: No abdominal pain.  Positive nausea, no vomiting. Positive diarrhea Genitourinary: Negative for  dysuria. Musculoskeletal: Negative for body aches. Skin: Negative for rash. Neurological: Negative for headaches, focal weakness or numbness.   ____________________________________________   PHYSICAL EXAM:  VITAL SIGNS: ED Triage Vitals  Enc Vitals Group     BP 05/23/17 1336 128/84     Pulse Rate 05/23/17 1336 100     Resp 05/23/17 1336 18     Temp 05/23/17 1336 98.7 F (37.1 C)     Temp Source 05/23/17 1336 Oral     SpO2 05/23/17 1336 99 %     Weight 05/23/17 1337 266 lb (120.7 kg)     Height --      Head Circumference --      Peak Flow --      Pain Score 05/23/17 1336 0     Pain Loc --      Pain Edu? --      Excl. in GC? --     Constitutional: Alert and oriented. Well appearing and in no acute distress. Eyes: Conjunctivae are normal. PERRL. EOMI. Head: Atraumatic. Nose: No congestion/rhinnorhea.  EACs and TMs are clear bilaterally. Mouth/Throat: Mucous membranes are moist.  Oropharynx non-erythematous. Neck: No stridor.   Hematological/Lymphatic/Immunilogical: No cervical lymphadenopathy. Cardiovascular: Normal rate, regular rhythm. Grossly normal heart sounds.  Good peripheral circulation. Respiratory: Normal respiratory effort.  No retractions. Lungs CTAB. Gastrointestinal: Soft and nontender. No distention.  No CVA tenderness. Bowel sounds 4 quadrants are slightly hyperactive. Musculoskeletal: No lower extremity tenderness nor edema.  No joint effusions. Neurologic:  Normal speech and language. No gross focal neurologic deficits are appreciated. No gait instability. Skin:  Skin is warm, dry  and intact. No rash noted. Psychiatric: Mood and affect are normal. Speech and behavior are normal.  ____________________________________________   LABS (all labs ordered are listed, but only abnormal results are displayed)  Labs Reviewed - No data to display  PROCEDURES  Procedure(s) performed: None  Procedures  Critical Care performed:  No  ____________________________________________   INITIAL IMPRESSION / ASSESSMENT AND PLAN / ED COURSE  Pertinent labs & imaging results that were available during my care of the patient were reviewed by me and considered in my medical decision making (see chart for details).  Orthostatics were positive for dehydration.  Patient was given a bolus of normal saline. Patient did not experience any nausea or diarrhea while in the department. Patient was discharged with prescription for Zofran 4 mg ODT if needed for nausea. She is to continue drinking fluids. Also she was here with a boyfriend who tested positive for influenza B. Both have had symptoms longer than 72 hours. Patient is aware and the reasons for not starting her on Tamiflu. Patient was given 2 days from work.      ____________________________________________   FINAL CLINICAL IMPRESSION(S) / ED DIAGNOSES  Final diagnoses:  Viral illness  Dehydration      NEW MEDICATIONS STARTED DURING THIS VISIT:  Discharge Medication List as of 05/23/2017  3:37 PM    START taking these medications   Details  ondansetron (ZOFRAN ODT) 4 MG disintegrating tablet Take 1 tablet (4 mg total) by mouth every 8 (eight) hours as needed for nausea or vomiting., Starting Sat 05/23/2017, Print         Note:  This document was prepared using Dragon voice recognition software and may include unintentional dictation errors.    Tommi Rumps, PA-C 05/23/17 1554    Arnaldo Natal, MD 05/25/17 [phone]

## 2017-05-23 NOTE — ED Triage Notes (Signed)
Pt to ed with c/o cough, congestion, diarrhea, and fever intermittently over the last week.

## 2017-05-23 NOTE — ED Notes (Addendum)
See triage note  States she developed body aches sore throat,congestion about a week ago. also has had diarrhea for about 1 week  Last diarrhea stool was over 12 hours ago Afebrile on arrival

## 2017-05-23 NOTE — Discharge Instructions (Signed)
Continue clear liquids for the next 34 hours. Zofran as needed for nausea. Slowly advance diet with bananas, rice, applesauce and plain toast. [facility] or [facility] if any continued problems.

## 2017-09-14 ENCOUNTER — Emergency Department
Admission: EM | Admit: 2017-09-14 | Discharge: 2017-09-14 | Disposition: A | Discharge status: Home / Self Care | Payer: Self-pay | Attending: Emergency Medicine | Admitting: Emergency Medicine

## 2017-09-14 DIAGNOSIS — F17290 Nicotine dependence, other tobacco product, uncomplicated: Secondary | ICD-10-CM | POA: Insufficient documentation

## 2017-09-14 DIAGNOSIS — F17210 Nicotine dependence, cigarettes, uncomplicated: Secondary | ICD-10-CM | POA: Insufficient documentation

## 2017-09-14 DIAGNOSIS — L0591 Pilonidal cyst without abscess: Secondary | ICD-10-CM | POA: Insufficient documentation

## 2017-09-14 MED ORDER — CLINDAMYCIN HCL 300 MG PO CAPS: 300.0000 mg | ORAL_CAPSULE | Freq: Three times a day (TID) | ORAL | 0 refills | Status: AC

## 2017-09-14 MED ORDER — LIDOCAINE HCL (PF) 1 % IJ SOLN
5.0000 mL | Freq: Once | INTRAMUSCULAR | Status: DC
  Filled 2017-09-14: qty 5

## 2017-09-14 NOTE — ED Provider Notes (Signed)
[facility] Emergency Department Provider Note  ____________________________________________  Time seen: Approximately 2:30 PM  I have reviewed the triage vital signs and the nursing notes.   HISTORY  Chief Complaint Tailbone Pain    HPI Jennifer Terrell is a 21 y.o. female that presents to the emergency department with pilonidal cyst for 3 days. Patient states that she gets this every couple of months but she does not have the money to see a Careers adviser. No fever, nausea, vomiting, abdominal pain.   Past Medical History:  Diagnosis Date  . Cyst near tailbone     There are no active problems to display for this patient.   Past Surgical History:  Procedure Laterality Date  . TONSILLECTOMY      Prior to Admission medications   Medication Sig Start Date End Date Taking? Authorizing Provider  clindamycin (CLEOCIN) 300 MG capsule Take 1 capsule (300 mg total) by mouth 3 (three) times daily. 09/14/17 09/24/17  Enid Derry, PA-C  ondansetron (ZOFRAN ODT) 4 MG disintegrating tablet Take 1 tablet (4 mg total) by mouth every 8 (eight) hours as needed for nausea or vomiting. 05/23/17   Tommi Rumps, PA-C    Allergies Hydrocodone and Percocet [oxycodone-acetaminophen]  No family history on file.  Social History Social History  Substance Use Topics  . Smoking status: Current Every Day Smoker    Packs/day: 0.50    Types: Cigarettes, E-cigarettes  . Smokeless tobacco: Never Used  . Alcohol use Yes     Comment: rare     Review of Systems  Constitutional: No fever/chills Cardiovascular: No chest pain. Respiratory: No SOB. Gastrointestinal: No abdominal pain.  No nausea, no vomiting.  Musculoskeletal: Negative for musculoskeletal pain. Skin: Negative for abrasions, lacerations, ecchymosis.   ____________________________________________   PHYSICAL EXAM:  VITAL SIGNS: ED Triage Vitals  Enc Vitals Group     BP 09/14/17 1328 121/83     Pulse  Rate 09/14/17 1328 (!) 108     Resp 09/14/17 1328 20     Temp 09/14/17 1328 98.8 F (37.1 C)     Temp Source 09/14/17 1328 Oral     SpO2 09/14/17 1328 98 %     Weight 09/14/17 1329 270 lb (122.5 kg)     Height 09/14/17 1329 5' 4.5" (1.638 m)     Head Circumference --      Peak Flow --      Pain Score 09/14/17 1326 2     Pain Loc --      Pain Edu? --      Excl. in GC? --      Constitutional: Alert and oriented. Well appearing and in no acute distress. Eyes: Conjunctivae are normal. PERRL. EOMI. Head: Atraumatic. ENT:      Ears:      Nose: No congestion/rhinnorhea.      Mouth/Throat: Mucous membranes are moist.  Neck: No stridor.  Cardiovascular: Normal rate, regular rhythm.  Good peripheral circulation. Respiratory: Normal respiratory effort without tachypnea or retractions. Lungs CTAB. Good air entry to the bases with no decreased or absent breath sounds. Musculoskeletal: Full range of motion to all extremities. No gross deformities appreciated.  Neurologic:  Normal speech and language. No gross focal neurologic deficits are appreciated.  Skin:  Skin is warm, dry and intact. 1 cm x 1 cm area of swelling and fluctuance with overlying erythema to the superior gluteal cleft on right buttocks. No surrounding erythema.   ____________________________________________   LABS (all labs ordered are listed,  but only abnormal results are displayed)  Labs Reviewed - No data to display ____________________________________________  EKG   ____________________________________________  RADIOLOGY   No results found.  ____________________________________________    PROCEDURES  Procedure(s) performed:    Procedures  INCISION AND DRAINAGE Performed by: Enid Derry Consent: Verbal consent obtained. Risks and benefits: risks, benefits and alternatives were discussed Type: abscess  Body area: gluteal cleft  Anesthesia: local infiltration  Incision was made with a  scalpel.  Local anesthetic: lidocaine 1% without epinephrine  Anesthetic total: 2 ml  Complexity: complex Blunt dissection to break up loculations  Drainage: purulent  Drainage amount: 2ccs  Packing material: 1/4 in iodoform gauze  Patient tolerance: Patient tolerated the procedure well with no immediate complications.    Medications  lidocaine (PF) (XYLOCAINE) 1 % injection 5 mL (not administered)     ____________________________________________   INITIAL IMPRESSION / ASSESSMENT AND PLAN / ED COURSE  Pertinent labs & imaging results that were available during my care of the patient were reviewed by me and considered in my medical decision making (see chart for details).  Review of the [place] CSRS was performed in accordance of the [facility] prior to dispensing any controlled drugs.   Patient's diagnosis is consistent with pilonidal cyst. Vital signs and exam are reassuring. Pilonidal cyst was drained and packed in ED. Patient will be discharged home with prescriptions for clindamycin. Patient is to follow up with PCP as directed. Patient is given ED precautions to return to the ED for any worsening or new symptoms.   ____________________________________________  FINAL CLINICAL IMPRESSION(S) / ED DIAGNOSES  Final diagnoses:  Pilonidal cyst      NEW MEDICATIONS STARTED DURING THIS VISIT:  Discharge Medication List as of 09/14/2017  3:29 PM    START taking these medications   Details  clindamycin (CLEOCIN) 300 MG capsule Take 1 capsule (300 mg total) by mouth 3 (three) times daily., Starting Mon 09/14/2017, Until Thu 09/24/2017, Print            This chart was dictated using voice recognition software/Dragon. Despite best efforts to proofread, errors can occur which can change the meaning. Any change was purely unintentional.    Enid Derry, PA-C 09/14/17 1554    Emily Filbert, MD 09/15/17 1037

## 2017-09-14 NOTE — ED Triage Notes (Signed)
Patient here with complaint of pilonidal cyst, states this is the 6th one that she has had.  Sx began 3 days ago.  Denies drainage.  Pain 2/10.  States she has sweated some at night but no known fever.

## 2017-09-14 NOTE — ED Notes (Signed)
See triage note  Presents with pain and swelling noted near tailbone  States sx's started about 3 days ago

## 2017-09-16 ENCOUNTER — Encounter: Payer: Self-pay | Admitting: Emergency Medicine

## 2017-09-16 ENCOUNTER — Emergency Department
Admission: EM | Admit: 2017-09-16 | Discharge: 2017-09-16 | Disposition: A | Discharge status: Home / Self Care | Payer: Self-pay | Attending: Emergency Medicine | Admitting: Emergency Medicine

## 2017-09-16 DIAGNOSIS — Z7689 Persons encountering health services in other specified circumstances: Secondary | ICD-10-CM

## 2017-09-16 DIAGNOSIS — Z5189 Encounter for other specified aftercare: Secondary | ICD-10-CM

## 2017-09-16 DIAGNOSIS — F17290 Nicotine dependence, other tobacco product, uncomplicated: Secondary | ICD-10-CM | POA: Insufficient documentation

## 2017-09-16 DIAGNOSIS — L0501 Pilonidal cyst with abscess: Secondary | ICD-10-CM | POA: Insufficient documentation

## 2017-09-16 DIAGNOSIS — F17210 Nicotine dependence, cigarettes, uncomplicated: Secondary | ICD-10-CM | POA: Insufficient documentation

## 2017-09-16 DIAGNOSIS — Z0189 Encounter for other specified special examinations: Secondary | ICD-10-CM

## 2017-09-16 MED ORDER — LIDOCAINE HCL (PF) 1 % IJ SOLN
5.0000 mL | Freq: Once | INTRAMUSCULAR | Status: AC
  Administered 2017-09-16: 5 mL
  Filled 2017-09-16: qty 5

## 2017-09-16 MED ORDER — SULFAMETHOXAZOLE-TRIMETHOPRIM 800-160 MG PO TABS: 1.0000 | ORAL_TABLET | Freq: Two times a day (BID) | ORAL | 0 refills | Status: AC

## 2017-09-16 MED ORDER — SULFAMETHOXAZOLE-TRIMETHOPRIM 800-160 MG PO TABS
1.0000 | ORAL_TABLET | Freq: Once | ORAL | Status: AC
  Administered 2017-09-16: 1 via ORAL
  Filled 2017-09-16: qty 1

## 2017-09-16 NOTE — Discharge Instructions (Signed)
Take the antibiotic as directed, until all pills are gone. Apply warm compresses to promote healing. Follow-up with your provider or this ED for wound check and packing removal in 3 days.

## 2017-09-16 NOTE — ED Triage Notes (Signed)
Pt here with c/o pain to tailbone, has recurring pilonidal cyst, had it drained the other day and here for wound check.

## 2017-09-16 NOTE — ED Notes (Signed)
Provider in room for wound procedure.

## 2017-09-16 NOTE — ED Provider Notes (Signed)
[facility] Emergency Department Provider Note ____________________________________________  Time seen: 1231  I have reviewed the triage vital signs and the nursing notes.  HISTORY  Chief Complaint  Cyst and Wound Check  HPI Jennifer Terrell is a 21 y.o. female Presents to the ED for evaluation of pain at the tailbone. She ha recurrent bilateral cysts, that she was seen in the ED for 2 days prior. She had a minimally successful incision and drainage 2 days prior. She reports that the packing that was in place, got pulled out accidentally.he reports continued pain and pressure to the area. She denies any spontaneous drainage, fevers, chills, sweats.  Past Medical History:  Diagnosis Date  . Cyst near tailbone     There are no active problems to display for this patient.   Past Surgical History:  Procedure Laterality Date  . TONSILLECTOMY      Prior to Admission medications   Medication Sig Start Date End Date Taking? Authorizing Provider  clindamycin (CLEOCIN) 300 MG capsule Take 1 capsule (300 mg total) by mouth 3 (three) times daily. 09/14/17 09/24/17  Enid Derry, PA-C  ondansetron (ZOFRAN ODT) 4 MG disintegrating tablet Take 1 tablet (4 mg total) by mouth every 8 (eight) hours as needed for nausea or vomiting. 05/23/17   Tommi Rumps, PA-C  sulfamethoxazole-trimethoprim (BACTRIM DS,SEPTRA DS) 800-160 MG tablet Take 1 tablet by mouth 2 (two) times daily. 09/16/17   Cattaleya Wien, Charlesetta Ivory, PA-C    Allergies Hydrocodone; Percocet [oxycodone-acetaminophen]; and Cleocin [clindamycin hcl]  No family history on file.  Social History Social History  Substance Use Topics  . Smoking status: Current Every Day Smoker    Packs/day: 0.50    Types: Cigarettes, E-cigarettes  . Smokeless tobacco: Never Used  . Alcohol use Yes     Comment: rare    Review of Systems  Constitutional: Negative for fever. Cardiovascular: Negative for chest  pain. Respiratory: Negative for shortness of breath. Gastrointestinal: Negative for abdominal pain, vomiting and diarrhea. Genitourinary: Negative for dysuria. Musculoskeletal: Negative for back pain. Skin: Negative for rash. Pilonidal cyst abscess as above. ____________________________________________  PHYSICAL EXAM:  VITAL SIGNS: ED Triage Vitals  Enc Vitals Group     BP 09/16/17 1157 125/81     Pulse Rate 09/16/17 1157 (!) 108     Resp --      Temp 09/16/17 1157 98.4 F (36.9 C)     Temp Source 09/16/17 1157 Oral     SpO2 09/16/17 1157 99 %     Weight 09/16/17 1152 270 lb (122.5 kg)     Height 09/16/17 1152  (1.626 m)     Head Circumference --      Peak Flow --      Pain Score 09/16/17 1152 10     Pain Loc --      Pain Edu? --      Excl. in GC? --     Constitutional: Alert and oriented. Well appearing and in no distress. Head: Normocephalic and atraumatic. Cardiovascular: Normal distal pulses. Respiratory: Normal respiratory effort.  Musculoskeletal: Nontender with normal range of motion in all extremities.  Neurologic:  Normal gait without ataxia. Normal speech and language. No gross focal neurologic deficits are appreciated. Skin:  Skin is warm, dry and intact. No rash noted. ____________________________________________  PROCEDURES  Bactrim DS 1 PO  INCISION AND DRAINAGE Performed by: Pollyann Glen, PA-S Sherrie Sport) Assisted by: Lissa Hoard Consent: Verbal consent obtained. Risks and benefits: risks, benefits  and alternatives were discussed Type: abscess  Body area: gluteal cleft  Anesthesia: local infiltration  Incision was made with a scalpel.  Local anesthetic: lidocaine 1% w/o epinephrine  Anesthetic total: 5 ml  Complexity: complex Blunt dissection to break up loculations  Drainage: purulent  Drainage amount: 10-15 ml  Packing material: 1/4 in iodoform gauze  Patient tolerance: Patient tolerated the procedure well with no  immediate complications. ___________________________________________  INITIAL IMPRESSION / ASSESSMENT AND PLAN / ED COURSE  Patient with ED evaluation of pilonidal cyst abscess. She is status post incision and drainage procedure today, which produced a large amount of purulent drainage. The pilonidal cyst is now flat in appearance. The wound is appropriately packed. The patient will follow-up in the ED in 2-3 Days for woud check and packing removal. She is given a prescription for Bactrim to dose as directed She is advised to apply warm compresses over the dressing to promote healing. A work note is provided for tomorrow as requested.  ____________________________________________  FINAL CLINICAL IMPRESSION(S) / ED DIAGNOSES  Final diagnoses:  Visit for wound check  Encounter for incision and drainage procedure  Pilonidal cyst with abscess      Lissa Hoard, PA-C 09/16/17 1330    Sharman Cheek, MD 09/18/17 1546

## 2017-09-18 ENCOUNTER — Emergency Department
Admission: EM | Admit: 2017-09-18 | Discharge: 2017-09-18 | Disposition: A | Discharge status: Home / Self Care | Payer: Self-pay | Attending: Emergency Medicine | Admitting: Emergency Medicine

## 2017-09-18 ENCOUNTER — Encounter: Payer: Self-pay | Admitting: Emergency Medicine

## 2017-09-18 DIAGNOSIS — F17200 Nicotine dependence, unspecified, uncomplicated: Secondary | ICD-10-CM | POA: Insufficient documentation

## 2017-09-18 DIAGNOSIS — Z4800 Encounter for change or removal of nonsurgical wound dressing: Secondary | ICD-10-CM | POA: Insufficient documentation

## 2017-09-18 DIAGNOSIS — Z09 Encounter for follow-up examination after completed treatment for conditions other than malignant neoplasm: Secondary | ICD-10-CM

## 2017-09-18 NOTE — ED Triage Notes (Signed)
Patient presents to the ED with an abscess to her sacral area that was drained and packed on Monday and again on Wednesday.  Patient states since Wednesday her pain has improved and the swelling has decreased to the area.  Patient is in no obvious distress at this time.

## 2017-09-18 NOTE — ED Provider Notes (Signed)
[facility] Emergency Department Provider Note ____________________________________________  Time seen: 1458  I have reviewed the triage vital signs and the nursing notes.  HISTORY  Chief Complaint  Wound Check  HPI Jennifer Terrell is a 21 y.o. female Presents to the ED for recheck of her pilonidal cyst abscess that was drained and packed on Wednesday by me. Patient reports improvement in her pain and swelling.  She denies any interim complaints. She is tolerating the antibiotic without difficulty.  Past Medical History:  Diagnosis Date  . Cyst near tailbone    There are no active problems to display for this patient.  Past Surgical History:  Procedure Laterality Date  . TONSILLECTOMY      Prior to Admission medications   Medication Sig Start Date End Date Taking? Authorizing Provider  clindamycin (CLEOCIN) 300 MG capsule Take 1 capsule (300 mg total) by mouth 3 (three) times daily. 09/14/17 09/24/17  Enid Derry, PA-C  ondansetron (ZOFRAN ODT) 4 MG disintegrating tablet Take 1 tablet (4 mg total) by mouth every 8 (eight) hours as needed for nausea or vomiting. 05/23/17   Tommi Rumps, PA-C  sulfamethoxazole-trimethoprim (BACTRIM DS,SEPTRA DS) 800-160 MG tablet Take 1 tablet by mouth 2 (two) times daily. 09/16/17   Aveena Bari, Charlesetta Ivory, PA-C    Allergies Hydrocodone; Percocet [oxycodone-acetaminophen]; and Cleocin [clindamycin hcl]  No family history on file.  Social History Social History  Substance Use Topics  . Smoking status: Current Every Day Smoker    Packs/day: 0.50    Types: Cigarettes, E-cigarettes  . Smokeless tobacco: Never Used  . Alcohol use Yes     Comment: rare    Review of Systems  Constitutional: Negative for fever. Cardiovascular: Negative for chest pain. Respiratory: Negative for shortness of breath. Gastrointestinal: Negative for abdominal pain, vomiting and diarrhea. Musculoskeletal: Negative for back pain. Skin:  Negative for rash. Healing abscess as above ____________________________________________  PHYSICAL EXAM:  VITAL SIGNS: ED Triage Vitals  Enc Vitals Group     BP 09/18/17 1350 (!) 114/94     Pulse Rate 09/18/17 1350 (!) 101     Resp 09/18/17 1350 16     Temp 09/18/17 1350 99.1 F (37.3 C)     Temp Source 09/18/17 1350 Oral     SpO2 09/18/17 1350 100 %     Weight 09/18/17 1351 270 lb (122.5 kg)     Height 09/18/17 1351  (1.651 m)     Head Circumference --      Peak Flow --      Pain Score 09/18/17 1350 1     Pain Loc --      Pain Edu? --      Excl. in GC? --     Constitutional: Alert and oriented. Well appearing and in no distress. Head: Normocephalic and atraumatic. Skin:  Skin is warm, dry and intact. No rash noted. Patient with a well-healing bilateral cyst abscess to the top of the gluteal cleft. There is no local erythema, induration, or warmth. The incision is cleaned with early healing of the edges. Wound packing is in place. The wound packing is removed showing some adherent purulence. The wound was then flushed with saline, with very minimal serous drainage noted.The wound is then covered with a large ABD pad. ____________________________________________  PROCEDURES  Abscess is flushed with 20 cc NS Clear/serous return noted Wound dressed with ABD pad ____________________________________________  INITIAL IMPRESSION / ASSESSMENT AND PLAN / ED COURSE  Patient with ED wound  check of an abscess to the pilonidal area status post incision and drainage. The wound is healing well with decrease purulent drainage. The patient were discharged to follow up with local medical clinic as needed. She should continue to dose antibiotics as previously prescribed. She should follow-up with surgery for an elective consultation for excision as discussed. ____________________________________________  FINAL CLINICAL IMPRESSION(S) / ED DIAGNOSES  Final diagnoses:  Encounter for  recheck of abscess following incision and drainage      Alyan Hartline, Charlesetta Ivory, PA-C 09/18/17 1521    Merrily Brittle, MD 09/18/17 1642

## 2017-09-18 NOTE — ED Notes (Signed)
See triage note  States she was seen twice this week for a cyst I&D  Here today for packing removal

## 2017-09-18 NOTE — Discharge Instructions (Signed)
Keep the wound clean, dry, and covered, until the skin heals. Avoid bath tub soaks or swimming until the wound is completely healed. Take the antibiotic as directed until all pills are gone. Follow-up with a local community clinic for routine care. Return to the ED as needed.

## 2017-09-22 ENCOUNTER — Telehealth: Payer: Self-pay | Admitting: General Practice

## 2017-09-22 NOTE — Telephone Encounter (Signed)
Spoke with the patient, she did not have the money at this time and asked if she could call back sometime next week, patient needs a ED Follow-up (09/18/17): Pilonidal Abscess for the next available surgeon. Please schedule if patient calls.

## 2017-10-07 NOTE — Telephone Encounter (Signed)
Spoke with patient again, said she needs to check her work schedule and would call back when she is ready to schedule appointment.

## 2017-10-28 ENCOUNTER — Encounter: Payer: Self-pay | Admitting: Emergency Medicine

## 2017-10-28 ENCOUNTER — Emergency Department
Admission: EM | Admit: 2017-10-28 | Discharge: 2017-10-28 | Disposition: A | Discharge status: Home / Self Care | Payer: Self-pay | Attending: Emergency Medicine | Admitting: Emergency Medicine

## 2017-10-28 DIAGNOSIS — F17210 Nicotine dependence, cigarettes, uncomplicated: Secondary | ICD-10-CM | POA: Insufficient documentation

## 2017-10-28 DIAGNOSIS — R0981 Nasal congestion: Secondary | ICD-10-CM | POA: Insufficient documentation

## 2017-10-28 DIAGNOSIS — Z79899 Other long term (current) drug therapy: Secondary | ICD-10-CM | POA: Insufficient documentation

## 2017-10-28 MED ORDER — PSEUDOEPH-BROMPHEN-DM 30-2-10 MG/5ML PO SYRP: 5.0000 mL | ORAL_SOLUTION | Freq: Four times a day (QID) | ORAL | 0 refills | Status: AC | PRN

## 2017-10-28 NOTE — ED Triage Notes (Signed)
Sinus congestion and mild facial pain began today.

## 2017-10-28 NOTE — ED Provider Notes (Signed)
[facility] Emergency Department Provider Note   ____________________________________________   First MD Initiated Contact with Patient 10/28/17 1228     (approximate)  I have reviewed the triage vital signs and the nursing notes.   HISTORY  Chief Complaint Facial Pain    HPI Jennifer Terrell is a 21 y.o. female patient complaining of sinus congestion and left facial pain which began today. Patient also states sore throat and a nonproductive cough. Patient denies fever associated this complaint. Patient denies nausea, vomiting, diarrhea. No palliative measures for this complaint.   Past Medical History:  Diagnosis Date  . Cyst near tailbone     There are no active problems to display for this patient.   Past Surgical History:  Procedure Laterality Date  . TONSILLECTOMY      Prior to Admission medications   Medication Sig Start Date End Date Taking? Authorizing Provider  brompheniramine-pseudoephedrine-DM 30-2-10 MG/5ML syrup Take 5 mLs by mouth 4 (four) times daily as needed. 10/28/17   Joni Reining, PA-C  ondansetron (ZOFRAN ODT) 4 MG disintegrating tablet Take 1 tablet (4 mg total) by mouth every 8 (eight) hours as needed for nausea or vomiting. 05/23/17   Tommi Rumps, PA-C  sulfamethoxazole-trimethoprim (BACTRIM DS,SEPTRA DS) 800-160 MG tablet Take 1 tablet by mouth 2 (two) times daily. 09/16/17   Menshew, Charlesetta Ivory, PA-C    Allergies Hydrocodone; Percocet [oxycodone-acetaminophen]; and Cleocin [clindamycin hcl]  No family history on file.  Social History Social History  Substance Use Topics  . Smoking status: Current Every Day Smoker    Packs/day: 0.50    Types: Cigarettes, E-cigarettes  . Smokeless tobacco: Never Used  . Alcohol use Yes     Comment: rare    Review of Systems  Constitutional: No fever/chills Eyes: No visual changes. ENT: Nasal congestion and sore throat  Cardiovascular: Denies chest  pain. Respiratory: Denies shortness of breath. Nonproductive cough Gastrointestinal: No abdominal pain.  No nausea, no vomiting.  No diarrhea.  No constipation. Genitourinary: Negative for dysuria. Musculoskeletal: Negative for back pain. Skin: Negative for rash. Neurological: Negative for headaches, focal weakness or numbness. Allergic/Immunilogical: See medication list ____________________________________________   PHYSICAL EXAM:  VITAL SIGNS: ED Triage Vitals  Enc Vitals Group     BP 10/28/17 1158 (!) 149/68     Pulse Rate 10/28/17 1158 92     Resp 10/28/17 1158 20     Temp 10/28/17 1158 99.6 F (37.6 C)     Temp Source 10/28/17 1158 Oral     SpO2 10/28/17 1158 100 %     Weight 10/28/17 1158 250 lb (113.4 kg)     Height 10/28/17 1158 5\' 4"  (1.626 m)     Head Circumference --      Peak Flow --      Pain Score 10/28/17 1208 3     Pain Loc --      Pain Edu? --      Excl. in GC? --     Constitutional: Alert and oriented. Well appearing and in no acute distress. Nose: Edematous nasal tones clear rhinorrhea. Left maxillary sinus guarding. Mouth/Throat: Mucous membranes are moist.  Oropharynx non-erythematous. Postnasal drainage Neck: No stridor.   Cardiovascular: Normal rate, regular rhythm. Grossly normal heart sounds.  Good peripheral circulation. Respiratory: Normal respiratory effort.  No retractions. Lungs CTAB. Neurologic:  Normal speech and language. No gross focal neurologic deficits are appreciated. No gait instability. Skin:  Skin is warm, dry and intact. No rash  noted. Psychiatric: Mood and affect are normal. Speech and behavior are normal.  ____________________________________________   LABS (all labs ordered are listed, but only abnormal results are displayed)  Labs Reviewed - No data to display ____________________________________________  EKG   ____________________________________________  RADIOLOGY  No results  found.  ____________________________________________   PROCEDURES  Procedure(s) performed: None  Procedures  Critical Care performed: No  ____________________________________________   INITIAL IMPRESSION / ASSESSMENT AND PLAN / ED COURSE  As part of my medical decision making, I reviewed the following data within the electronic MEDICAL RECORD NUMBER    Patient pain secondary sinus congestion. Patient given discharge Instructions. Patient advised take medication as directed. Patient given a work note for today. [facility].      ____________________________________________   FINAL CLINICAL IMPRESSION(S) / ED DIAGNOSES  Final diagnoses:  Sinus congestion      NEW MEDICATIONS STARTED DURING THIS VISIT:  Discharge Medication List as of 10/28/2017 12:35 PM    START taking these medications   Details  brompheniramine-pseudoephedrine-DM 30-2-10 MG/5ML syrup Take 5 mLs by mouth 4 (four) times daily as needed., Starting Wed 10/28/2017, Print         Note:  This document was prepared using Dragon voice recognition software and may include unintentional dictation errors.    Joni ReiningSmith, Thaddeus Evitts K, PA-C 10/28/17 1253    Don PerkingVeronese, [place], MD 10/29/17 [phone]

## 2017-10-28 NOTE — ED Notes (Signed)
NAD noted at time of D/C. Pt denies questions or concerns. Pt ambulatory to the lobby at this time.  

## 2017-10-28 NOTE — ED Notes (Signed)
[facility] and dx with bacterial infection and given abx cream. States Tuesday woke up dizzy and nauseous, states today woke up with a sore throat, continued dizziness, and nausea. Pt is alert and oriented, NAD noted.

## 2018-07-31 IMAGING — CR DG CHEST 2V
2 series · 2 of 2 positions shown · non-contrast
Comparison: None.

CLINICAL DATA: Left-sided chest pain, some nausea

EXAM:
CHEST  2 VIEW

[chest pa]
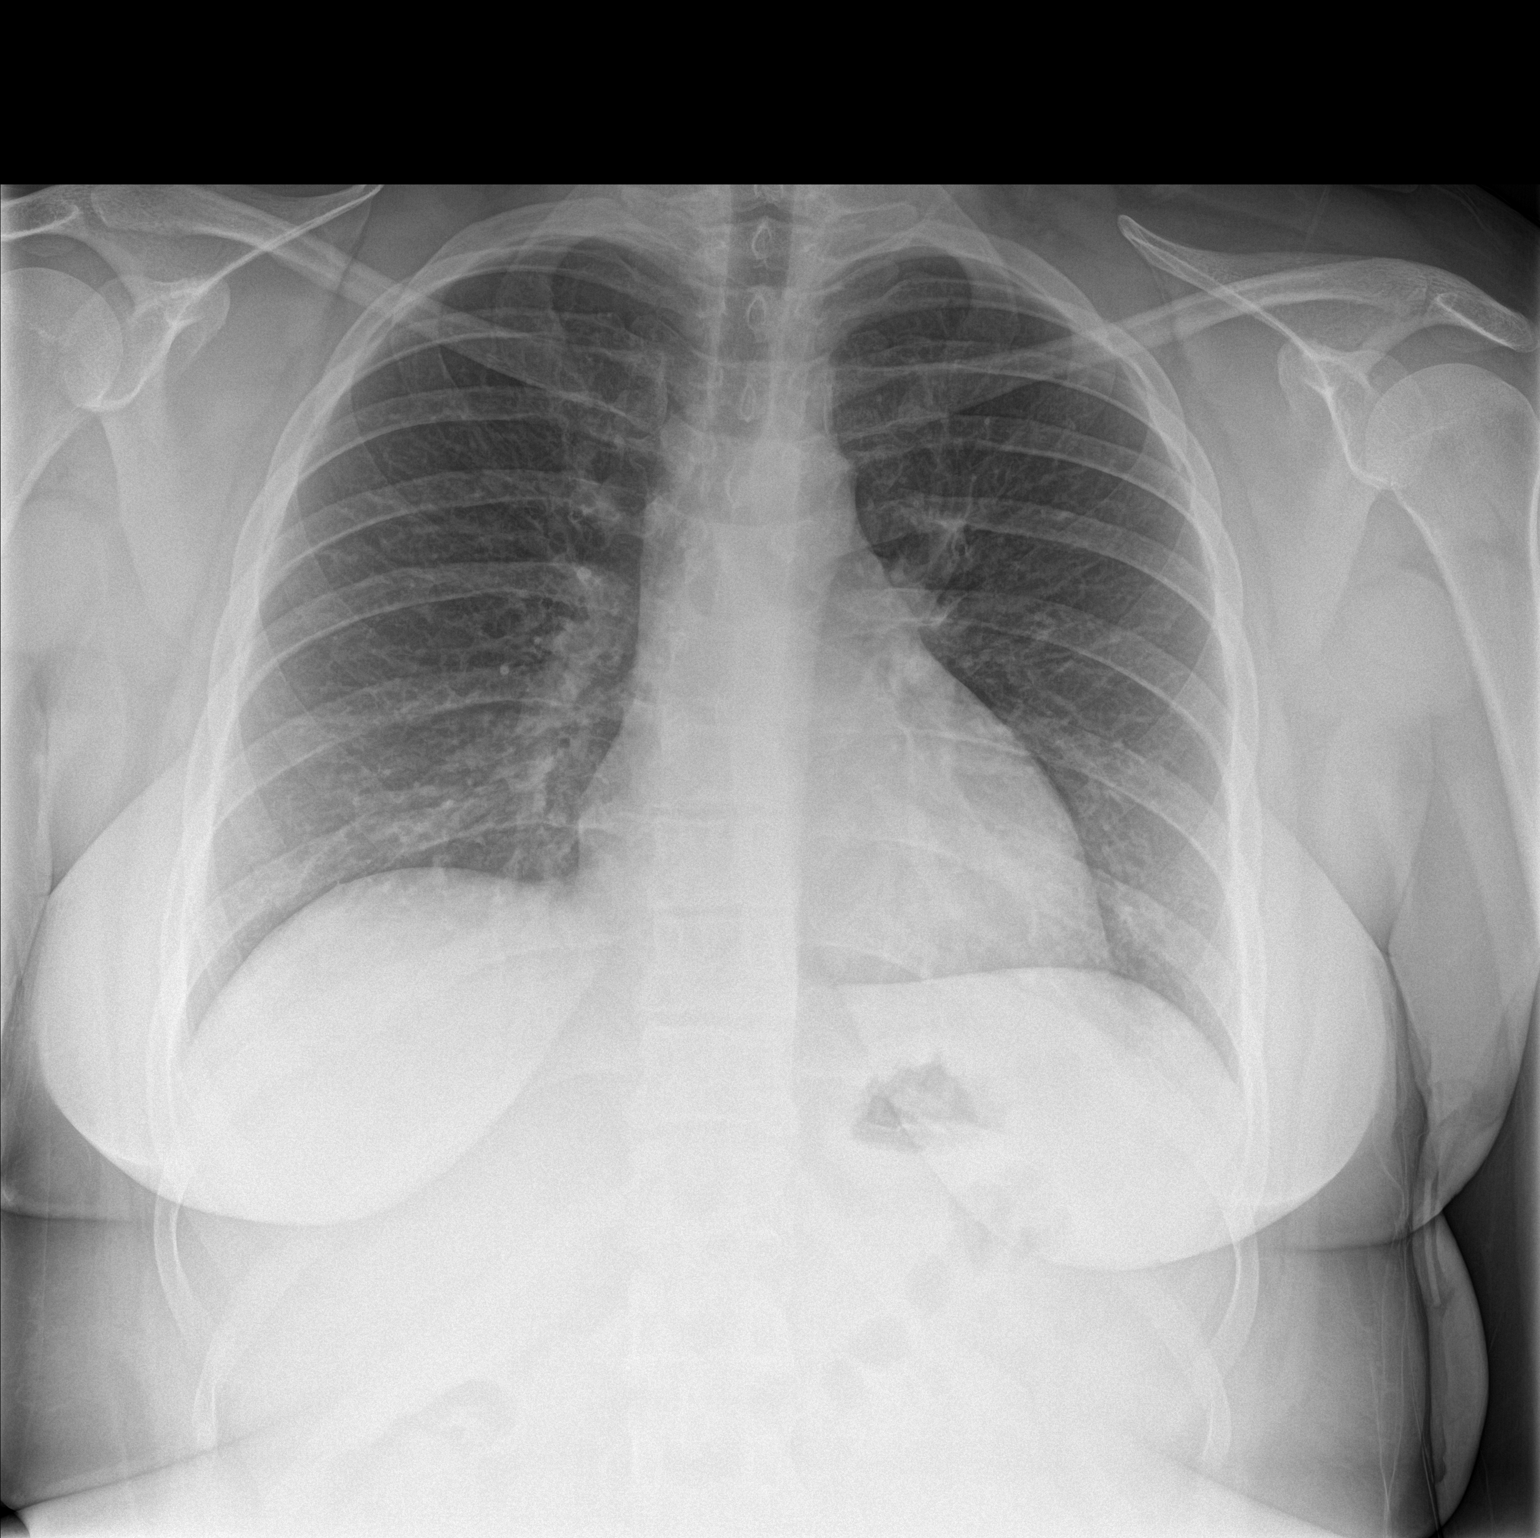

[chest lat]
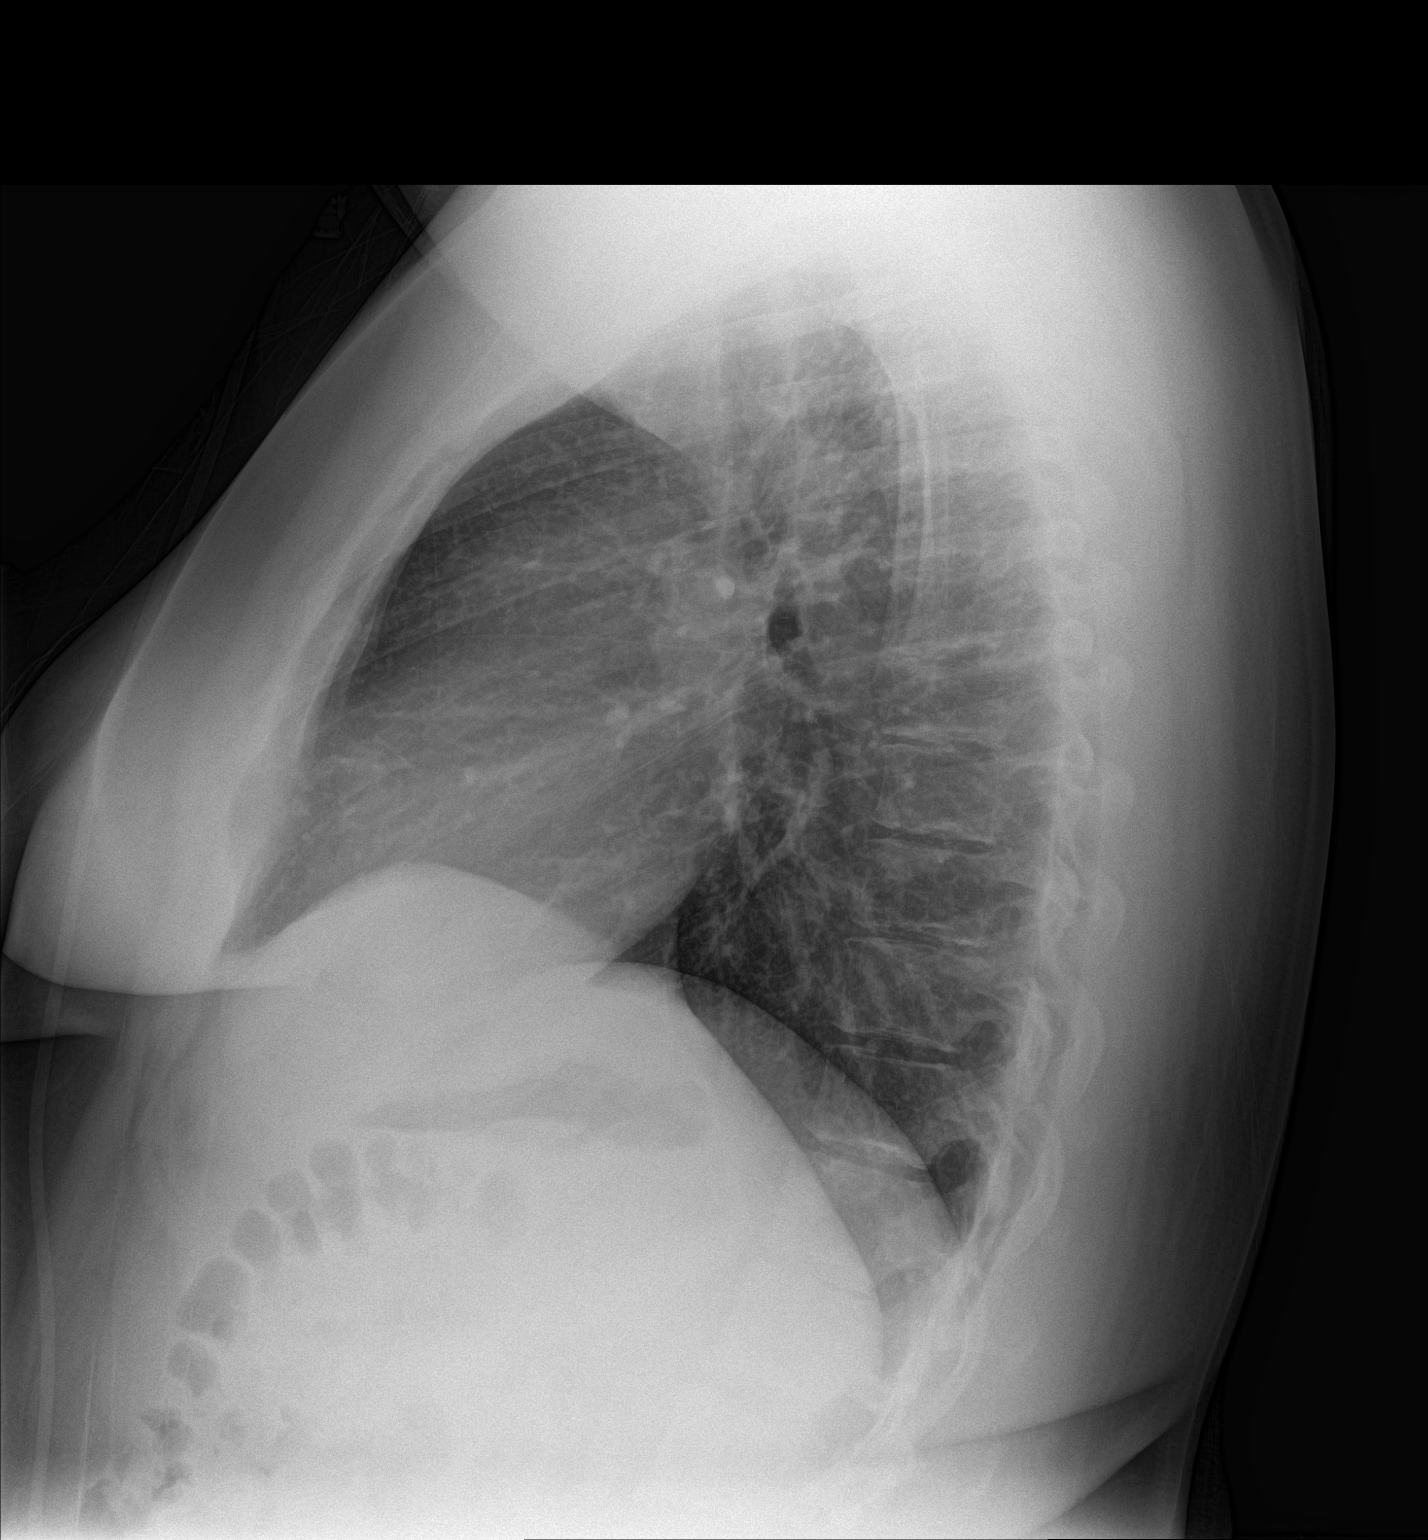

[2 of 2 positions shown; findings below may reference images not displayed]

FINDINGS: No active infiltrate or effusion is seen. Mediastinal and hilar
contours are unremarkable. The heart is within normal limits in
size. No bony abnormality is seen.
IMPRESSION: No active cardiopulmonary disease.

## 2019-08-16 DIAGNOSIS — F419 Anxiety disorder, unspecified: Secondary | ICD-10-CM

## 2019-08-16 DIAGNOSIS — F331 Major depressive disorder, recurrent, moderate: Secondary | ICD-10-CM

## 2019-08-17 ENCOUNTER — Other Ambulatory Visit: Payer: Self-pay

## 2019-08-17 ENCOUNTER — Ambulatory Visit (LOCAL_COMMUNITY_HEALTH_CENTER): Payer: Self-pay | Admitting: Family Medicine

## 2019-08-17 VITALS — BP 111/78 | Ht 65.5 in | Wt 266.2 lb

## 2019-08-17 DIAGNOSIS — Z3009 Encounter for other general counseling and advice on contraception: Secondary | ICD-10-CM

## 2019-08-17 DIAGNOSIS — Z3046 Encounter for surveillance of implantable subdermal contraceptive: Secondary | ICD-10-CM

## 2019-08-17 NOTE — Progress Notes (Signed)
Pt desired to use condoms for birth control; consent form signed and condoms given.

## 2019-08-17 NOTE — Progress Notes (Signed)
Last physical at [facility] 08/12/2016. Nexplanon insertion 08/19/2016. Pap due 07/2019 per [facility] record. Pt desires nexplanon removal and wants to discuss other forms of birth control, unsure of what she wants to use (really wants BTL). Also concerned about PSOS. Last sex was approximatly 1 month ago.

## 2019-08-17 NOTE — Progress Notes (Signed)
Nexplanon Removal Patient identified, informed consent performed, consent signed.   Appropriate time out taken. Nexplanon site identified.  Area prepped in usual sterile fashon. 3 ml of 1% lidocaine with Epinephrine was used to anesthetize the area at the distal end of the implant and along implant site. A small stab incision was made right beside the implant on the distal portion.  The Nexplanon rod was grasped manually and removed without difficulty.  There was minimal blood loss. There were no complications.  Steri-strips were applied over the small incision.  A pressure bandage was applied to reduce any bruising.  The patient tolerated the procedure well and was given post procedure instructions.  Co to   Take ibuprofen 600-800- mg po q 6-8 hrs prn pain. Ice pack to arm for swelling and pain prn. Co. Will use condoms for birth control.  She is considering the Paragard IUD.  She will contact clinic for insertion.

## 2019-08-21 LAB — IGP, RFX APTIMA HPV ASCU: PAP Smear Comment: 0

## 2020-06-01 ENCOUNTER — Telehealth: Payer: Self-pay | Admitting: Family Medicine

## 2020-06-01 NOTE — Telephone Encounter (Signed)
Pt is wanting an appointment for Paraguard IUD placement.

## 2020-06-01 NOTE — Telephone Encounter (Signed)
This RN returned call to pt at phone # provided. Pt reports that she would like to come in for a Paraguard IUD insertion. Pt reports last sex was 2-3 weeks ago. Went over IUD consult with pt and answered pt's questions. Counseled pt since it has been a while since we've seen her for a physical that we could schedule her for a PE and IUD insertion and pt states that would be good. PE and Paragaurd IUD insertion scheduled for 06/05/2020 at 3:05pm per pt request. Pt aware of appt date and time and to arrive early for check-in. Counseled pt that from now until coming in for appt to abstain from sex and pt states understanding. Pt with no further questions or concerns at this time.

## 2020-06-05 ENCOUNTER — Other Ambulatory Visit: Payer: Self-pay

## 2020-06-05 ENCOUNTER — Ambulatory Visit (LOCAL_COMMUNITY_HEALTH_CENTER): Payer: Self-pay | Admitting: Advanced Practice Midwife

## 2020-06-05 ENCOUNTER — Encounter: Payer: Self-pay | Admitting: Advanced Practice Midwife

## 2020-06-05 VITALS — BP 120/77 | Ht 66.0 in | Wt 270.0 lb

## 2020-06-05 DIAGNOSIS — E669 Obesity, unspecified: Secondary | ICD-10-CM

## 2020-06-05 DIAGNOSIS — Z3009 Encounter for other general counseling and advice on contraception: Secondary | ICD-10-CM

## 2020-06-05 DIAGNOSIS — R630 Anorexia: Secondary | ICD-10-CM | POA: Insufficient documentation

## 2020-06-05 DIAGNOSIS — Z30430 Encounter for insertion of intrauterine contraceptive device: Secondary | ICD-10-CM

## 2020-06-05 DIAGNOSIS — Z7289 Other problems related to lifestyle: Secondary | ICD-10-CM

## 2020-06-05 LAB — WET PREP FOR TRICH, YEAST, CLUE
Trichomonas Exam: NEGATIVE
Yeast Exam: NEGATIVE

## 2020-06-05 MED ORDER — PARAGARD INTRAUTERINE COPPER IU IUD: INTRAUTERINE_SYSTEM | Freq: Once | INTRAUTERINE | Status: AC

## 2020-06-05 NOTE — Progress Notes (Signed)
[employer] results retrieved from lab after patient appt completed. Tawny Hopping, RN

## 2020-06-05 NOTE — Progress Notes (Addendum)
Family Planning Visit- Initial Visit  Subjective:  Jennifer Terrell is a 24 y.o. SWF vaper  [id number]   being seen today for an initial well woman visit and to discuss family planning options.  She is currently using condoms sometimes for pregnancy prevention. Patient reports she does not want a pregnancy in the next year.  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  Chief Complaint  Patient presents with  . Contraception    IUD Insertion    Patient reports LMP 05/29/20.  Last pap 08/17/19 neg.  Last PE 08/12/2016.  Nexplanon removed 08/17/19.  Vapes daily.  Last ETOH 03/2020 (2 shots Baileys) 1x/mo.  Last sex 04/20/20 with condom.  1 sex partner x 4.5 years.  Working 40-43 hrs/wk at [employer]  Will be moving soon with boyfriend to [place].  Lives with boyfriend  Patient denies MJ, cigs   Body mass index is 43.58 kg/m. - Patient is eligible for diabetes screening based on BMI and age >14?  not applicable HF0Y ordered? not applicable  Patient reports 2 of partners in last year. Desires STI screening?  Yes  Has patient been screened once for HCV in the past?  No  No results found for: HCVAB  Does the patient have current of drug use, have a partner with drug use, and/or has been incarcerated since last result? No  If yes-- Screen for HCV through [facility]   Does the patient meet criteria for HBV testing? No  Criteria:  -Household, sexual or needle sharing contact with HBV -History of drug use -HIV positive -Those with known Hep C   Health Maintenance Due  Topic Date Due  . Hepatitis C Screening  Never done  . COVID-19 Vaccine (1) Never done  . TETANUS/TDAP  Never done  . PAP-Cervical Cytology Screening  08/13/2019    Review of Systems  All other systems reviewed and are negative.   The  following portions of the patient's history were reviewed and updated as appropriate: allergies, current medications, past family history, past medical history, past social history, past surgical history and problem list. Problem list updated.   See flowsheet for other program required questions.  Objective:   Vitals:   06/05/20 1506  BP: 120/77  Weight: 270 lb (122.5 kg)  Height: 5\' 6"  (1.676 m)    Physical Exam Constitutional:      Appearance: Normal appearance. She is obese.  HENT:     Head: Normocephalic and atraumatic.     Mouth/Throat:     Mouth: Mucous membranes are moist.  Eyes:     Conjunctiva/sclera: Conjunctivae normal.  Cardiovascular:     Rate and Rhythm: Normal rate and regular rhythm.  Pulmonary:     Effort: Pulmonary effort is normal.     Breath sounds: Normal breath sounds.  Chest:     Breasts:        Right: Normal.        Left: Normal.  Abdominal:     Palpations: Abdomen is soft.     Comments: Poor tone, soft without tenderness, increased adipose  Genitourinary:    General: Normal vulva.     Exam position: Lithotomy position.     Vagina: Vaginal discharge (grey creamy leukorrhea, ph>4.5) present.     Cervix: Normal.     Uterus: Normal.  Adnexa: Right adnexa normal and left adnexa normal.     Rectum: Normal.     Comments: Paraguard IUD placed Musculoskeletal:        General: Normal range of motion.     Cervical back: Normal range of motion and neck supple.  Skin:    General: Skin is warm and dry.  Neurological:     Mental Status: She is alert.  Psychiatric:        Mood and Affect: Mood normal.       Assessment and Plan:  Jennifer Terrell is a 24 y.o. female presenting to the [facility] Department for an initial well woman exam/family planning visit  Contraception counseling: Reviewed all forms of birth control options in the tiered based approach. available including abstinence; over the counter/barrier methods; hormonal  contraceptive medication including pill, patch, ring, injection,contraceptive implant, ECP; hormonal and nonhormonal IUDs; permanent sterilization options including vasectomy and the various tubal sterilization modalities. Risks, benefits, and typical effectiveness rates were reviewed.  Questions were answered.  Written information was also given to the patient to review.  Patient desires Paraguard IUD, this was prescribed for patient. She will follow up in  prn for surveillance.  She was told to call with any further questions, or with any concerns about this method of contraception.  Emphasized use of condoms 100% of the time for STI prevention.  Patient was offered ECP. ECP was not accepted by the patient. ECP counseling was not given - see RN documentation  1. Family planning PHQ-9=24 Treat wet mount per standing orders Immunization nurse consult Please give pt Mental Health resource numbers (pt declines Kathreen Cosier) - Chlamydia/Gonorrhea [facility] - WET PREP FOR TRICH, YEAST, CLUE - paragard intrauterine copper IUD - Ambulatory referral to Behavioral Health  2. Encounter for insertion of intrauterine contraceptive device (IUD)    Patient presented to [facility] for IUD insertion. Her GC/CT screening was found to be up to date and using WHO criteria we can be reasonably certain she is not pregnant . See Flowsheet for IUD check list  IUD Insertion Procedure Note Patient identified, informed consent performed, consent signed.   Discussed risks of irregular bleeding, cramping, infection, malpositioning or misplacement of the IUD outside the uterus which may require further procedure such as laparoscopy. Time out was performed.    Speculum placed in the vagina.  Cervix visualized.  Cleaned with Betadine x 2.  Anterior cervix lip Grasped anteriorly with a single tooth tenaculum.  Uterus sounded to 9 cm.  IUD placed per manufacturer's recommendations.  Strings trimmed to 3 cm. Tenaculum was  removed, good hemostasis noted.  Patient tolerated procedure well. Entire insertion supervised by Larene Pickett, NP  Patient was given post-procedure instructions- both agency handout and verbally by provider.  She was advised to have backup contraception for one week.  Patient was also asked to check IUD strings periodically or follow up in 4 weeks for IUD check. - paragard intrauterine copper IUD  3. Obesity, unspecified classification, unspecified obesity type, unspecified whether serious comorbidity present   4. 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 Anorexia age 53      Return for PRN.  No future appointments.  Alberteen Spindle, CNM

## 2020-06-05 NOTE — Progress Notes (Signed)
Here today for PE and Paragard IUD Insertion. Last PE and Pap Smear was 08/12/2016. IUD consult completed. Tawny Hopping, RN

## 2020-06-22 ENCOUNTER — Ambulatory Visit (LOCAL_COMMUNITY_HEALTH_CENTER): Payer: Self-pay | Admitting: Advanced Practice Midwife

## 2020-06-22 ENCOUNTER — Other Ambulatory Visit: Payer: Self-pay

## 2020-06-22 ENCOUNTER — Encounter: Payer: Self-pay | Admitting: Advanced Practice Midwife

## 2020-06-22 VITALS — BP 119/81 | Ht 65.0 in | Wt 274.0 lb

## 2020-06-22 DIAGNOSIS — Z30431 Encounter for routine checking of intrauterine contraceptive device: Secondary | ICD-10-CM

## 2020-06-22 DIAGNOSIS — Z3009 Encounter for other general counseling and advice on contraception: Secondary | ICD-10-CM

## 2020-06-22 NOTE — Progress Notes (Addendum)
Here today for IUD check. Started with heavy vaginal bleeding 06/20/2020. States she can now feel strings. States boyfriend check ed for strings 06/19/2020 and "pulled strings down." Declines STD screening today.  Tawny Hopping, RN

## 2020-06-22 NOTE — Progress Notes (Signed)
   [facility] problem [facility]  [facility]  Subjective:  Jennifer Terrell is a 24 y.o. SWF vaper being seen today for had Paraguard IUD inserted 06/05/20.  Couldn't feel strings so asked boyfriend to check for strings which he did on 06/19/20 and he pulled on the IUD to "straighten the strings".  The next day she said she began bleeding "heavy" and yesterday had cramps as well.  Today she felt strings.  LMP 05/29/20.  Last sex 04/20/20.  Chief Complaint  Patient presents with  . Vaginal Bleeding    IUD concerns    HPI   Does the patient have a current or past history of drug use? Yes   No components found for: HCV]   Health Maintenance Due  Topic Date Due  . Hepatitis C Screening  Never done  . COVID-19 Vaccine (1) Never done  . TETANUS/TDAP  Never done  . PAP-Cervical Cytology Screening  08/13/2019    ROS  The following portions of the patient's history were reviewed and updated as appropriate: allergies, current medications, past family history, past medical history, past social history, past surgical history and problem list. Problem list updated.   See flowsheet for other program required questions.  Objective:   Vitals:   06/22/20 1105  BP: 119/81  Weight: 274 lb (124.3 kg)  Height: 5\' 5"  (1.651 m)    Physical Exam  Vagina--very small amt light red blood in vagina near cx.  Paraguard strings visible in os.  Bimanual exam with neg CMT, no IUD felt in os, without tenderness  Assessment and Plan:  Jennifer Terrell is a 24 y.o. female [facility] for a Women's Health problem visit  1. Family planning   2. Encounter for routine checking of intrauterine contraceptive device (IUD) Normal IUD check.  Counseled pt to not allow anyone to "check" her IUD except medical provider as he could have accidentally dislodged/removed IUD     No follow-ups on file.  No future appointments.  [facility], CNM
